# Patient Record
Sex: Female | Born: 1985 | Race: White | Hispanic: No | Marital: Married | Smoking: Former smoker
Health system: Southern US, Community
[De-identification: ages and names within clinical notes are randomized; demographics above are authoritative.]

## PROBLEM LIST (undated history)

## (undated) HISTORY — PX: WISDOM TOOTH EXTRACTION: SHX21

## (undated) HISTORY — PX: TUBAL LIGATION: SHX77

## (undated) HISTORY — PX: OTHER SURGICAL HISTORY: SHX169

---

## 2015-04-30 ENCOUNTER — Ambulatory Visit
Admission: EM | Admit: 2015-04-30 | Discharge: 2015-04-30 | Disposition: A | Discharge status: Home / Self Care | Payer: BLUE CROSS/BLUE SHIELD | Attending: Family Medicine | Admitting: Family Medicine

## 2015-04-30 ENCOUNTER — Encounter: Payer: Self-pay | Admitting: Emergency Medicine

## 2015-04-30 DIAGNOSIS — T192XXA Foreign body in vulva and vagina, initial encounter: Secondary | ICD-10-CM | POA: Diagnosis not present

## 2015-04-30 LAB — URINALYSIS COMPLETE WITH MICROSCOPIC (ARMC ONLY)
BACTERIA UA: NONE SEEN
BILIRUBIN URINE: NEGATIVE
GLUCOSE, UA: NEGATIVE mg/dL
Hgb urine dipstick: NEGATIVE
KETONES UR: NEGATIVE mg/dL
LEUKOCYTES UA: NEGATIVE
Nitrite: NEGATIVE
PH: 7 (ref 5.0–8.0)
Protein, ur: NEGATIVE mg/dL
Specific Gravity, Urine: 1.02 (ref 1.005–1.030)

## 2015-04-30 NOTE — ED Provider Notes (Signed)
CSN: [id number]     Arrival date & time 04/30/15  1634 History   First MD Initiated Contact with Patient 04/30/15 1735     Chief Complaint  Patient presents with  . Vaginal Discharge  . Abdominal Pain   (Consider location/radiation/quality/duration/timing/severity/associated sxs/prior Treatment) HPI  29 yo F with one day hx of malodorous vaginal discharge; dark in color. Had menses 04/28/15  Attempt at intercourse clast night interrupted by malodor and  Dark drainage.   No dysuria , frequency or other bladder symptoms  History reviewed. No pertinent past medical history. Past Surgical History  Procedure Laterality Date  . Tubal ligation     History reviewed. No pertinent family history. Social History  Substance Use Topics  . Smoking status: Former Games developermoker  . Smokeless tobacco: None  . Alcohol Use: Yes   OB History    No data available     Review of Systems 10 Systems reviewed and are negative for acute change except as noted in the HPI.  Allergies  Bactrim  Home Medications   Prior to Admission medications   Medication Sig Start Date End Date Taking? Authorizing Provider  spironolactone (ALDACTONE) 50 MG tablet Take 50 mg by mouth daily.   Yes Historical Provider, MD   Meds Ordered and Administered this Visit  Medications - No data to display  BP 107/63 mmHg  Pulse 67  Temp(Src) 97.5 F (36.4 C) (Tympanic)  Resp 16  Ht 5\' 3"  (1.6 m)  Wt 140 lb (63.504 kg)  BMI 24.81 kg/m2  SpO2 100%  LMP 04/28/2015 (Exact Date) No data found.   Physical Exam   VS noted, WNL  GENERAL : NAD HEENT: atraumatic , normocephalic RESP: CTA  B , no wheezing, no accessory muscle use CARD: RRR ABD: Not distended, soft , + BS, no masses GU- EG BUS wnl, fully shaved, marital introitus,mucosa healthy,CX large clean parous, retained tampon present in upper vault- removed without difficulty, No irritation or erosion of vagina. Uterus ML NSS , ad neg , RV deferred. NEURO: Good  attention, good recall, no gross neuro defecit PSYCH: speech and behavior appropriate  ED Course  Procedures (including critical care time)  Labs Review Labs Reviewed  URINALYSIS COMPLETEWITH MICROSCOPIC ([facility] ONLY) - Abnormal; Notable for the following:    Squamous Epithelial / LPF 0-5 (*)    All other components within normal limits  URINE CULTURE    Imaging Review No results found.     MDM   1. Retained tampon, initial encounter    Diagnosis and treatment discussed.Vinegar and water pre-mix douche  X one. Pelvic rest tonight.   Questions fielded, expectations and recommendations reviewed. Reasurred very common occurrence. .Discussed follow up and return parameters including no resolution or any worsening condition..  Patient expresses understanding  and agrees to plan. [facility] questions, concerns or exacerbation.     Rae HalstedLaurie W Lee, PA-C 04/30/15 1819

## 2015-04-30 NOTE — Discharge Instructions (Signed)
discussed

## 2015-04-30 NOTE — ED Notes (Signed)
Patient c/o vaginal discharge and abdominal pain on her left side since yesterday.  Patient denies N/V.

## 2015-05-02 LAB — URINE CULTURE: SPECIAL REQUESTS: NORMAL

## 2015-05-02 NOTE — ED Notes (Signed)
Final report of urine C&S shows insignificant growth 

## 2015-07-06 ENCOUNTER — Ambulatory Visit
Admission: EM | Admit: 2015-07-06 | Discharge: 2015-07-06 | Disposition: A | Discharge status: Home / Self Care | Payer: BLUE CROSS/BLUE SHIELD | Attending: Family Medicine | Admitting: Family Medicine

## 2015-07-06 ENCOUNTER — Ambulatory Visit (INDEPENDENT_AMBULATORY_CARE_PROVIDER_SITE_OTHER): Payer: BLUE CROSS/BLUE SHIELD

## 2015-07-06 DIAGNOSIS — M542 Cervicalgia: Secondary | ICD-10-CM | POA: Diagnosis not present

## 2015-07-06 DIAGNOSIS — S161XXA Strain of muscle, fascia and tendon at neck level, initial encounter: Secondary | ICD-10-CM

## 2015-07-06 DIAGNOSIS — T7411XA Adult physical abuse, confirmed, initial encounter: Secondary | ICD-10-CM | POA: Diagnosis not present
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
## 2015-10-02 ENCOUNTER — Ambulatory Visit
Admission: EM | Admit: 2015-10-02 | Discharge: 2015-10-02 | Disposition: A | Discharge status: Home / Self Care | Payer: PRIVATE HEALTH INSURANCE

## 2015-10-02 NOTE — ED Notes (Signed)
Pt here for random drug screen only. Does not need to see physician.

## 2019-04-15 ENCOUNTER — Emergency Department: Payer: BC Managed Care – PPO

## 2019-04-15 ENCOUNTER — Encounter: Payer: Self-pay | Admitting: Emergency Medicine

## 2019-04-15 ENCOUNTER — Emergency Department
Admission: EM | Admit: 2019-04-15 | Discharge: 2019-04-16 | Disposition: A | Discharge status: Home / Self Care | Payer: BC Managed Care – PPO | Attending: Emergency Medicine | Admitting: Emergency Medicine

## 2019-04-15 ENCOUNTER — Other Ambulatory Visit: Payer: Self-pay

## 2019-04-15 DIAGNOSIS — O209 Hemorrhage in early pregnancy, unspecified: Secondary | ICD-10-CM

## 2019-04-15 DIAGNOSIS — N939 Abnormal uterine and vaginal bleeding, unspecified: Secondary | ICD-10-CM

## 2019-04-15 DIAGNOSIS — Z3A00 Weeks of gestation of pregnancy not specified: Secondary | ICD-10-CM | POA: Diagnosis not present

## 2019-04-15 DIAGNOSIS — Z87891 Personal history of nicotine dependence: Secondary | ICD-10-CM | POA: Diagnosis not present

## 2019-04-15 LAB — URINALYSIS, COMPLETE (UACMP) WITH MICROSCOPIC
Bilirubin Urine: NEGATIVE
Glucose, UA: NEGATIVE mg/dL
Ketones, ur: NEGATIVE mg/dL
Leukocytes,Ua: NEGATIVE
Nitrite: NEGATIVE
Protein, ur: NEGATIVE mg/dL
RBC / HPF: >50 RBC/hpf — ABNORMAL HIGH (ref 0–5)
Specific Gravity, Urine: 1.01 (ref 1.005–1.030)
pH: 7 (ref 5.0–8.0)

## 2019-04-15 LAB — COMPREHENSIVE METABOLIC PANEL
ALT: 36 U/L (ref 0–44)
AST: 26 U/L (ref 15–41)
Albumin: 4.3 g/dL (ref 3.5–5.0)
Alkaline Phosphatase: 44 U/L (ref 38–126)
Anion gap: 10 (ref 5–15)
BUN: 17 mg/dL (ref 6–20)
CO2: 25 mmol/L (ref 22–32)
Calcium: 9.5 mg/dL (ref 8.9–10.3)
Chloride: 104 mmol/L (ref 98–111)
Creatinine, Ser: 0.88 mg/dL (ref 0.44–1.00)
GFR calc Af Amer: >60 mL/min (ref >60)
GFR calc non Af Amer: >60 mL/min (ref >60)
Glucose, Bld: 89 mg/dL (ref 70–99)
Potassium: 3.3 mmol/L — ABNORMAL LOW (ref 3.5–5.1)
Sodium: 139 mmol/L (ref 135–145)
Total Bilirubin: 0.5 mg/dL (ref 0.3–1.2)
Total Protein: 7.4 g/dL (ref 6.5–8.1)

## 2019-04-15 LAB — POCT PREGNANCY, URINE: Preg Test, Ur: POSITIVE — AB

## 2019-04-15 LAB — CBC
HCT: 36.7 % (ref 36.0–46.0)
Hemoglobin: 12.1 g/dL (ref 12.0–15.0)
MCH: 31.3 pg (ref 26.0–34.0)
MCHC: 33 g/dL (ref 30.0–36.0)
MCV: 95.1 fL (ref 80.0–100.0)
Platelets: 268 10*3/uL (ref 150–400)
RBC: 3.86 MIL/uL — ABNORMAL LOW (ref 3.87–5.11)
RDW: 11.9 % (ref 11.5–15.5)
WBC: 11.5 10*3/uL — ABNORMAL HIGH (ref 4.0–10.5)
nRBC: 0 % (ref 0.0–0.2)

## 2019-04-15 LAB — ABO/RH: ABO/RH(D): A POS

## 2019-04-15 LAB — LIPASE, BLOOD: Lipase: 36 U/L (ref 11–51)

## 2019-04-15 LAB — HCG, QUANTITATIVE, PREGNANCY: hCG, Beta Chain, Quant, S: 1045 m[IU]/mL — ABNORMAL HIGH

## 2019-04-15 MED ORDER — METHOTREXATE FOR ECTOPIC PREGNANCY
50.0000 mg/m2 | Freq: Once | INTRAMUSCULAR | Status: AC
  Administered 2019-04-16: 90 mg via INTRAMUSCULAR
  Filled 2019-04-15: qty 1

## 2019-04-15 NOTE — Discharge Instructions (Signed)
Please follow the instructions Dr. Ouida Sills gave you.  He wants you to get a beta-hCG echo noted clinic on the 17th and again 28th in the morning.  He will see you in [place] clinic on the 20th in the afternoon.  He is return for lightheadedness severe pain or very heavy bleeding.  Saturating a pad an hour is usually what they want you to return for.  To you follow all of his instructions carefully.

## 2019-04-15 NOTE — ED Notes (Addendum)
Pt reports intermittent cramping for the past few days; LMP 9/24 pt is known to be pregnant but has not been given a due date; has seen her OB/GYN but they did not see anything on [place] when she went for her first appointment; pt reports bleeding started around 3pm today; bright red; no clots; pt awake and alert, talking in complete coherent sentences; G3P2A0

## 2019-04-15 NOTE — ED Notes (Signed)
Pelvic exam performed by Dr Cinda Quest with ED tech Melody at bedside

## 2019-04-15 NOTE — ED Provider Notes (Signed)
[facility] Emergency Department Provider Note   ____________________________________________   First MD Initiated Contact with Patient 04/15/19 2156     (approximate)  I have reviewed the triage vital signs and the nursing notes.   HISTORY  Chief Complaint Vaginal Bleeding    HPI Kelsey Campbell is a 33 y.o. female positive pregnancy test 3 weeks ago.  Patient followed at [facility] clinic.  hCGs were not doubling.  Some cramping bleeding starting today.         History reviewed. No pertinent past medical history.  There are no active problems to display for this patient.   Past Surgical History:  Procedure Laterality Date  . TUBAL LIGATION    . tubal ligation reversal      Prior to Admission medications   Medication Sig Start Date End Date Taking? Authorizing Provider  spironolactone (ALDACTONE) 50 MG tablet Take 50 mg by mouth daily.    [provider]    Allergies Bactrim [sulfamethoxazole-trimethoprim]  No family history on file.  Social History Social History   Tobacco Use  . Smoking status: Former Research scientist (life sciences)  . Smokeless tobacco: Never Used  Substance Use Topics  . Alcohol use: Not Currently    Comment: occasionally  . Drug use: Not on file    Review of Systems  Constitutional: No fever/chills Eyes: No visual changes. ENT: No sore throat. Cardiovascular: Denies chest pain. Respiratory: Denies shortness of breath. Gastrointestinal: No abdominal pain.  No nausea, no vomiting.  No diarrhea.  No constipation. Genitourinary: Negative for dysuria. Musculoskeletal: Negative for back pain. Skin: Negative for rash. Neurological: Negative for headaches, focal weakness or  ____________________________________________   PHYSICAL EXAM:  VITAL SIGNS: ED Triage Vitals  Enc Vitals Group     BP 04/15/19 1854 115/72     Pulse Rate 04/15/19 1854 84     Resp 04/15/19 1854 16     Temp 04/15/19 1854 98.3 F (36.8 C)   Temp Source 04/15/19 1854 Oral     SpO2 04/15/19 1854 100 %     Weight 04/15/19 1855 165 lb (74.8 kg)     Height 04/15/19 1855 5\' 3"  (1.6 m)     Head Circumference --      Peak Flow --      Pain Score 04/15/19 1855 4     Pain Loc --      Pain Edu? --      Excl. in [place]? --     Constitutional: Alert and oriented. Well appearing and in no acute distress. Eyes: Conjunctivae are normal.  Head: Atraumatic. Nose: No congestion/rhinnorhea. Mouth/Throat: Mucous membranes are moist.  Oropharynx non-erythematous. Neck: No stridor.   Cardiovascular: Normal rate, regular rhythm. Grossly normal heart sounds.  Good peripheral circulation. Respiratory: Normal respiratory effort.  No retractions. Lungs CTAB. Gastrointestinal: Soft mild lower abdominal tenderness no distention. No abdominal bruits. GU: Some blood on perineum.  Some dark blood in vagina.  Otherwise vagina looks normal.'s difficult to reach but feels closed.  No active bleeding currently.  Minimal cervical motion tenderness no palpable adnexal tenderness or masses. Musculoskeletal: No lower extremity tenderness nor edema.   Neurologic:  Normal speech and language. No gross focal neurologic deficits are appreciated.  Skin:  Skin is warm, dry and intact. No rash noted.  ____________________________________________   LABS (all labs ordered are listed, but only abnormal results are displayed)  Labs Reviewed  COMPREHENSIVE METABOLIC PANEL - Abnormal; Notable for the following components:      Result  Value   Potassium 3.3 (*)    All other components within normal limits  CBC - Abnormal; Notable for the following components:   WBC 11.5 (*)    RBC 3.86 (*)    All other components within normal limits  URINALYSIS, COMPLETE (UACMP) WITH MICROSCOPIC - Abnormal; Notable for the following components:   Color, Urine STRAW (*)    APPearance CLEAR (*)    Hgb urine dipstick LARGE (*)    RBC / HPF >50 (*)    Bacteria, UA RARE (*)    All other  components within normal limits  HCG, QUANTITATIVE, PREGNANCY - Abnormal; Notable for the following components:   hCG, Beta Chain, Quant, S 1,045 (*)    All other components within normal limits  POCT PREGNANCY, URINE - Abnormal; Notable for the following components:   Preg Test, Ur POSITIVE (*)    All other components within normal limits  LIPASE, BLOOD  POC URINE PREG, ED  ABO/RH   ____________________________________________  EKG   ____________________________________________  RADIOLOGY  ED MD interpretation: OB reports a cyst.  Official radiology report(s): US Ob Less Than 14 Weeks With Ob Transvaginal  Result Date: 04/15/2019 CLINICAL DATA:  Vaginal bleeding EXAM: OBSTETRIC <14 WK [place] AND TRANSVAGINAL OB US TECHNIQUE: Both transabdominal and transvaginal ultrasound examinations were performed for complete evaluation of the gestation as well as the maternal uterus, adnexal regions, and pelvic cul-de-sac. Transvaginal technique was performed to assess early pregnancy. COMPARISON:  None. FINDINGS: Intrauterine gestational sac: None Yolk sac:  Not visualized Embryo:  Not visualized Cardiac Activity: Heart Rate:   bpm MSD:   mm    w     d CRL:    mm    w    d                  [place] EDC: Subchorionic hemorrhage:  NA Maternal uterus/adnexae: In the right adnexal region, there is a small cystic structure with thick wall measuring 1.9 x 1.7 cm. It is difficult to determine if this is within the right ovary or immediately adjacent. Cannot exclude ectopic pregnancy. IMPRESSION: No intrauterine pregnancy. 1.9 x 1.7 cm thick walled cystic area within the right adnexa. It is difficult to determine if this is within or immediately adjacent to the right ovary. It is difficult to exclude ectopic pregnancy. Electronically Signed   By: Charlett Nose M.D.   On: 04/15/2019 21:41    ____________________________________________   PROCEDURES  Procedure(s) performed (including Critical Care):  Procedures    ____________________________________________   INITIAL IMPRESSION / ASSESSMENT AND PLAN / ED COURSE  I discussed the patient with Dr. Feliberto Gottron.  He is going to come in and talk to her.  Consider methotrexate.              ____________________________________________   FINAL CLINICAL IMPRESSION(S) / ED DIAGNOSES  Final diagnoses:  Vaginal bleeding affecting early pregnancy     ED Discharge Orders    None       Note:  This document was prepared using Dragon voice recognition software and may include unintentional dictation errors.    Arnaldo Natal, MD 04/15/19 2223

## 2019-04-15 NOTE — ED Notes (Signed)
Pt resting in bed; husband at bedside; OB/GYN has already been in to consult with pt; pt given water as requested;

## 2019-04-15 NOTE — ED Triage Notes (Signed)
Patient presents to the ED with lower abdominal cramping and vaginal bleeding.  Patient states, the bleeding seems like a menstrual period.  Patient had a positive pregnancy test 3 weeks ago.  LMP:  02/23/19.  Patient states she is followed by an OB and they were concerned because her HCG levels weren't doubling.  Patient also reports a recent US where they did not see an embryo. Patient states she had a tubal ligation reversal on August 3rd.

## 2019-04-15 NOTE — ED Notes (Signed)
Awaiting certified RN for methotrexate injection

## 2019-04-16 NOTE — ED Notes (Signed)
Secretary paging Dr Ouida Sills for Dr Aldona Lento chemo certified RN working or reachable at home to come give injection;

## 2019-04-16 NOTE — Consult Note (Signed)
NAME: Kelsey Campbell, Kelsey Campbell MEDICAL RECORD [id number] ACCOUNT [id number] DATE OF BIRTH:September 21, 1985 FACILITY: [facility] LOCATION: [facility] PHYSICIAN:THOMAS Josefine Class, MD  CONSULTATION  DATE OF ADMISSION:  04/15/2019  REFERRING PHYSICIAN:  Conni Slipper, MD  CONSULTANT:  Laverta Baltimore, MD  HISTORY OF PRESENT ILLNESS:  A 33 year old gravida 3, para 2 patient being followed at [facility] for early gestation.  LMP 02/23/2019, consistent with 7 weeks 2 days by dating.  The patient presented to the Emergency Department for generalized  lower pelvic pain and uterine bleeding.  The patient is known to have a tubal reanastomosis in 12/2018 and has been followed at [facility] with the following quantitative beta hCG:  04/06/2019 equals 753; 04/11/2019 equals 938; 04/13/2019 equals  981.  Blood type A positive.  The patient underwent an ultrasound at [facility] Emergency Department that showed an empty uterine sac and a right adnexal mass measuring 1.9 x 1.7 cm.  PAST MEDICAL HISTORY:  Unremarkable.  PAST SURGICAL HISTORY: 1.  Status post bilateral tubal ligation. 2.  Bilateral tubal reanastomosis on 12/2018. 3.  Ovarian cystectomy.  FAMILY HISTORY:   1.  Alzheimer disease in maternal grandfather. 2.  Type 2 diabetes in maternal aunt and maternal grandfather. 3.  Prostate cancer in maternal grandfather. 4.  Throat cancer in maternal grandfather.  REVIEW OF SYSTEMS:   EYES, EARS, NOSE AND THROAT:  No vision change.  No dysphagia.  No hearing difficulties. CARDIAC:  No chest pain, no irregular heartbeat. PULMONARY:  No shortness of breath. GASTROINTESTINAL:  No diarrhea, no constipation. GENITOURINARY:  Positive vaginal bleeding.  No chronic urinary infections, no urolithiasis. MUSCULOSKELETAL:  No muscle weakness. NEUROLOGIC:  No headache, no seizure activity. PSYCHIATRIC:  No depression.  SOCIAL HISTORY:  Has quit smoking; has used alcohol in the past,  none currently.  No drug use.  ALLERGIES:  ALLERGIC TO BACTRIM.  MEDICATIONS:  Prenatal vitamins, Phenergan.  PHYSICAL EXAMINATION: GENERAL:  Well-developed, well-nourished white female in no acute distress. VITAL SIGNS:  Blood pressure 120/72, pulse 101. LUNGS:  Clear to auscultation.   CARDIOVASCULAR:  Regular rate and rhythm. ABDOMEN:  Nondistended, soft, mild tenderness to palpation in bilateral lower quadrants. PELVIC:  Exam is deferred.  LABORATORY DATA:  CBC:  White blood count 11,000; hemoglobin 12.1; hematocrit 36; platelets 268,000.  Metabolic panel:  Sodium 562, potassium 3.3, chloride 104, bicarbonate 25, BUN and creatinine of 17 and 0.8 respectively.  Beta hCG 1045.  ASSESSMENT:  Abnormal rising beta hCG levels with an empty uterus on pelvic ultrasound.  Right adnexal mass consistent with a right ectopic pregnancy.  The patient is at increased risk for an ectopic pregnancy given her recent tubal anastomosis.  The  patient is clinically stable and labs are normal other than mild hypokalemia.    PLANS:   1.  Explained the pathophysiology of an ectopic pregnancy to the patient.  The patient meets criteria for methotrexate treatment for an ectopic pregnancy.  I gave her the option for surgical intervention with right salpingectomy.  After consideration,  the patient elects to undergo methotrexate treatment.  The patient will be injected with 92 mg of methotrexate based on her body surface area and 50 mg/sq m calculation.  The patient will return to [facility] on 04/18/2019, which is post-injection  day 4, and 04/21/2019, which is post-injection day 7. 2.  The patient was instructed to stop prenatal vitamins and refrain from vegetables and green leafy vegetables.  She is given precautions if pain increases or bleeding becomes  extremely heavy, that she will call me at [facility] or return to the  Emergency Department.  All questions have been answered.  JN/NUANCE  D:04/15/2019 T:04/16/2019 JOB:[id number]

## 2019-04-21 ENCOUNTER — Other Ambulatory Visit: Payer: Self-pay

## 2019-04-21 ENCOUNTER — Other Ambulatory Visit: Payer: Self-pay | Admitting: Obstetrics and Gynecology

## 2019-04-21 ENCOUNTER — Ambulatory Visit
Admission: RE | Admit: 2019-04-21 | Discharge: 2019-04-21 | Disposition: A | Discharge status: Home / Self Care | Payer: BC Managed Care – PPO | Source: Ambulatory Visit | Attending: Obstetrics and Gynecology | Admitting: Obstetrics and Gynecology

## 2019-04-21 DIAGNOSIS — O09521 Supervision of elderly multigravida, first trimester: Secondary | ICD-10-CM | POA: Insufficient documentation

## 2019-04-21 DIAGNOSIS — O009 Unspecified ectopic pregnancy without intrauterine pregnancy: Secondary | ICD-10-CM | POA: Insufficient documentation

## 2019-04-21 DIAGNOSIS — Z3A01 Less than 8 weeks gestation of pregnancy: Secondary | ICD-10-CM | POA: Insufficient documentation

## 2019-04-21 MED ORDER — METHOTREXATE FOR ECTOPIC PREGNANCY
50.0000 mg/m2 | Freq: Once | INTRAMUSCULAR | Status: AC
  Administered 2019-04-21: 90 mg via INTRAMUSCULAR
  Filled 2019-04-21: qty 1

## 2019-05-03 ENCOUNTER — Ambulatory Visit: Payer: BC Managed Care – PPO | Admitting: Certified Registered"

## 2019-05-03 ENCOUNTER — Other Ambulatory Visit: Payer: Self-pay

## 2019-05-03 ENCOUNTER — Other Ambulatory Visit: Payer: Self-pay | Admitting: Obstetrics and Gynecology

## 2019-05-03 ENCOUNTER — Ambulatory Visit
Admission: AD | Admit: 2019-05-03 | Discharge: 2019-05-03 | Disposition: A | Discharge status: Home / Self Care | Payer: BC Managed Care – PPO | Source: Ambulatory Visit | Attending: Obstetrics and Gynecology | Admitting: Obstetrics and Gynecology

## 2019-05-03 ENCOUNTER — Encounter
Admission: AD | Disposition: A | Discharge status: Home / Self Care | Payer: Self-pay | Source: Ambulatory Visit | Attending: Obstetrics and Gynecology

## 2019-05-03 ENCOUNTER — Encounter: Payer: Self-pay | Admitting: Anesthesiology

## 2019-05-03 DIAGNOSIS — Z20828 Contact with and (suspected) exposure to other viral communicable diseases: Secondary | ICD-10-CM | POA: Insufficient documentation

## 2019-05-03 DIAGNOSIS — K660 Peritoneal adhesions (postprocedural) (postinfection): Secondary | ICD-10-CM | POA: Insufficient documentation

## 2019-05-03 DIAGNOSIS — Z881 Allergy status to other antibiotic agents status: Secondary | ICD-10-CM | POA: Diagnosis not present

## 2019-05-03 DIAGNOSIS — O00101 Right tubal pregnancy without intrauterine pregnancy: Secondary | ICD-10-CM | POA: Diagnosis present

## 2019-05-03 DIAGNOSIS — K661 Hemoperitoneum: Secondary | ICD-10-CM | POA: Diagnosis not present

## 2019-05-03 DIAGNOSIS — Z3A00 Weeks of gestation of pregnancy not specified: Secondary | ICD-10-CM | POA: Diagnosis not present

## 2019-05-03 DIAGNOSIS — Z87891 Personal history of nicotine dependence: Secondary | ICD-10-CM | POA: Diagnosis not present

## 2019-05-03 HISTORY — PX: LAPAROSCOPIC OVARIAN CYSTECTOMY: SHX6248

## 2019-05-03 LAB — SARS CORONAVIRUS 2 BY RT PCR (HOSPITAL ORDER, PERFORMED IN ~~LOC~~ HOSPITAL LAB): SARS Coronavirus 2: NEGATIVE

## 2019-05-03 LAB — CBC
HCT: 33.4 % — ABNORMAL LOW (ref 36.0–46.0)
Hemoglobin: 11.4 g/dL — ABNORMAL LOW (ref 12.0–15.0)
MCH: 31.8 pg (ref 26.0–34.0)
MCHC: 34.1 g/dL (ref 30.0–36.0)
MCV: 93 fL (ref 80.0–100.0)
Platelets: 290 10*3/uL (ref 150–400)
RBC: 3.59 MIL/uL — ABNORMAL LOW (ref 3.87–5.11)
RDW: 12.8 % (ref 11.5–15.5)
WBC: 8.8 10*3/uL (ref 4.0–10.5)
nRBC: 0 % (ref 0.0–0.2)

## 2019-05-03 LAB — BASIC METABOLIC PANEL
Anion gap: 9 (ref 5–15)
BUN: 16 mg/dL (ref 6–20)
CO2: 25 mmol/L (ref 22–32)
Calcium: 9.4 mg/dL (ref 8.9–10.3)
Chloride: 104 mmol/L (ref 98–111)
Creatinine, Ser: 0.78 mg/dL (ref 0.44–1.00)
GFR calc Af Amer: >60 mL/min (ref >60)
GFR calc non Af Amer: >60 mL/min (ref >60)
Glucose, Bld: 102 mg/dL — ABNORMAL HIGH (ref 70–99)
Potassium: 3.9 mmol/L (ref 3.5–5.1)
Sodium: 138 mmol/L (ref 135–145)

## 2019-05-03 LAB — TYPE AND SCREEN
ABO/RH(D): A POS
Antibody Screen: NEGATIVE

## 2019-05-03 SURGERY — EXCISION, CYST, OVARY, LAPAROSCOPIC
Anesthesia: General

## 2019-05-03 MED ORDER — MIDAZOLAM HCL 2 MG/2ML IJ SOLN
INTRAMUSCULAR | Status: AC
  Filled 2019-05-03: qty 2

## 2019-05-03 MED ORDER — FENTANYL CITRATE (PF) 100 MCG/2ML IJ SOLN
INTRAMUSCULAR | Status: DC | PRN
  Administered 2019-05-03 (×2): 50 ug via INTRAVENOUS
  Administered 2019-05-03: 100 ug via INTRAVENOUS

## 2019-05-03 MED ORDER — FAMOTIDINE 20 MG PO TABS
20.0000 mg | ORAL_TABLET | Freq: Once | ORAL | Status: AC
  Administered 2019-05-03: 20 mg via ORAL

## 2019-05-03 MED ORDER — FENTANYL CITRATE (PF) 100 MCG/2ML IJ SOLN
INTRAMUSCULAR | Status: AC
  Filled 2019-05-03: qty 2

## 2019-05-03 MED ORDER — OXYCODONE HCL 5 MG PO TABS
ORAL_TABLET | ORAL | Status: AC
  Filled 2019-05-03: qty 1

## 2019-05-03 MED ORDER — PHENYLEPHRINE HCL (PRESSORS) 10 MG/ML IV SOLN
INTRAVENOUS | Status: DC | PRN
  Administered 2019-05-03: 100 ug via INTRAVENOUS

## 2019-05-03 MED ORDER — LACTATED RINGERS IV SOLN: INTRAVENOUS | Status: DC

## 2019-05-03 MED ORDER — SUGAMMADEX SODIUM 200 MG/2ML IV SOLN
INTRAVENOUS | Status: AC
  Filled 2019-05-03: qty 2

## 2019-05-03 MED ORDER — GABAPENTIN 300 MG PO CAPS
ORAL_CAPSULE | ORAL | Status: AC
  Filled 2019-05-03: qty 1

## 2019-05-03 MED ORDER — LIDOCAINE HCL (CARDIAC) PF 100 MG/5ML IV SOSY
PREFILLED_SYRINGE | INTRAVENOUS | Status: DC | PRN
  Administered 2019-05-03: 60 mg via INTRAVENOUS

## 2019-05-03 MED ORDER — ONDANSETRON HCL 4 MG/2ML IJ SOLN
INTRAMUSCULAR | Status: AC
  Filled 2019-05-03: qty 2

## 2019-05-03 MED ORDER — PROPOFOL 10 MG/ML IV BOLUS
INTRAVENOUS | Status: AC
  Filled 2019-05-03: qty 20

## 2019-05-03 MED ORDER — ONDANSETRON HCL 4 MG/2ML IJ SOLN
INTRAMUSCULAR | Status: DC | PRN
  Administered 2019-05-03: 4 mg via INTRAVENOUS

## 2019-05-03 MED ORDER — ROCURONIUM BROMIDE 100 MG/10ML IV SOLN
INTRAVENOUS | Status: DC | PRN
  Administered 2019-05-03: 5 mg via INTRAVENOUS
  Administered 2019-05-03: 45 mg via INTRAVENOUS

## 2019-05-03 MED ORDER — OXYCODONE HCL 5 MG/5ML PO SOLN: 5.0000 mg | Freq: Once | ORAL | Status: AC | PRN

## 2019-05-03 MED ORDER — SUCCINYLCHOLINE CHLORIDE 20 MG/ML IJ SOLN
INTRAMUSCULAR | Status: DC | PRN
  Administered 2019-05-03: 100 mg via INTRAVENOUS

## 2019-05-03 MED ORDER — MIDAZOLAM HCL 2 MG/2ML IJ SOLN
INTRAMUSCULAR | Status: DC | PRN
  Administered 2019-05-03: 2 mg via INTRAVENOUS

## 2019-05-03 MED ORDER — ROCURONIUM BROMIDE 50 MG/5ML IV SOLN
INTRAVENOUS | Status: AC
  Filled 2019-05-03: qty 1

## 2019-05-03 MED ORDER — FAMOTIDINE 20 MG PO TABS
ORAL_TABLET | ORAL | Status: AC
  Filled 2019-05-03: qty 1

## 2019-05-03 MED ORDER — DEXAMETHASONE SODIUM PHOSPHATE 10 MG/ML IJ SOLN
INTRAMUSCULAR | Status: DC | PRN
  Administered 2019-05-03: 10 mg via INTRAVENOUS

## 2019-05-03 MED ORDER — SUGAMMADEX SODIUM 200 MG/2ML IV SOLN
INTRAVENOUS | Status: DC | PRN
  Administered 2019-05-03: 150 mg via INTRAVENOUS

## 2019-05-03 MED ORDER — DEXAMETHASONE SODIUM PHOSPHATE 10 MG/ML IJ SOLN
INTRAMUSCULAR | Status: AC
  Filled 2019-05-03: qty 1

## 2019-05-03 MED ORDER — BUPIVACAINE HCL 0.5 % IJ SOLN
INTRAMUSCULAR | Status: DC | PRN
  Administered 2019-05-03: 8 mL

## 2019-05-03 MED ORDER — LACTATED RINGERS IV SOLN
INTRAVENOUS | Status: DC
  Administered 2019-05-03: 18:00:00 via INTRAVENOUS

## 2019-05-03 MED ORDER — OXYCODONE HCL 5 MG PO TABS
5.0000 mg | ORAL_TABLET | Freq: Once | ORAL | Status: AC | PRN
  Administered 2019-05-03: 5 mg via ORAL

## 2019-05-03 MED ORDER — BUPIVACAINE HCL (PF) 0.5 % IJ SOLN
INTRAMUSCULAR | Status: AC
  Filled 2019-05-03: qty 30

## 2019-05-03 MED ORDER — FENTANYL CITRATE (PF) 100 MCG/2ML IJ SOLN
25.0000 ug | INTRAMUSCULAR | Status: DC | PRN
  Administered 2019-05-03 (×2): 50 ug via INTRAVENOUS

## 2019-05-03 MED ORDER — GABAPENTIN 300 MG PO CAPS
300.0000 mg | ORAL_CAPSULE | ORAL | Status: AC
  Administered 2019-05-03: 300 mg via ORAL

## 2019-05-03 MED ORDER — ACETAMINOPHEN 500 MG PO TABS
1000.0000 mg | ORAL_TABLET | ORAL | Status: AC
  Administered 2019-05-03: 1000 mg via ORAL

## 2019-05-03 MED ORDER — FENTANYL CITRATE (PF) 100 MCG/2ML IJ SOLN
INTRAMUSCULAR | Status: AC
  Administered 2019-05-03: 50 ug via INTRAVENOUS
  Filled 2019-05-03: qty 2

## 2019-05-03 MED ORDER — ACETAMINOPHEN 500 MG PO TABS
ORAL_TABLET | ORAL | Status: AC
  Filled 2019-05-03: qty 2

## 2019-05-03 MED ORDER — PROPOFOL 10 MG/ML IV BOLUS
INTRAVENOUS | Status: DC | PRN
  Administered 2019-05-03: 170 mg via INTRAVENOUS

## 2019-05-03 SURGICAL SUPPLY — 38 items
BAG URINE DRAIN 2000ML AR STRL (UROLOGICAL SUPPLIES) ×3 IMPLANT
BLADE SURG SZ11 CARB STEEL (BLADE) ×3 IMPLANT
CANISTER SUCT 1200ML W/VALVE (MISCELLANEOUS) ×3 IMPLANT
CATH ROBINSON RED A/P 16FR (CATHETERS) ×3 IMPLANT
CHLORAPREP W/TINT 26 (MISCELLANEOUS) ×3 IMPLANT
CLOSURE WOUND 1/4X4 (GAUZE/BANDAGES/DRESSINGS) ×1
COVER WAND RF STERILE (DRAPES) IMPLANT
DRSG TEGADERM 2-3/8X2-3/4 SM (GAUZE/BANDAGES/DRESSINGS) ×3 IMPLANT
GLOVE BIO SURGEON STRL SZ8 (GLOVE) ×6 IMPLANT
GOWN STRL REUS W/ TWL LRG LVL3 (GOWN DISPOSABLE) ×2 IMPLANT
GOWN STRL REUS W/ TWL XL LVL3 (GOWN DISPOSABLE) ×1 IMPLANT
GOWN STRL REUS W/TWL LRG LVL3 (GOWN DISPOSABLE) ×4
GOWN STRL REUS W/TWL XL LVL3 (GOWN DISPOSABLE) ×2
GRASPER SUT TROCAR 14GX15 (MISCELLANEOUS) IMPLANT
IRRIGATION STRYKERFLOW (MISCELLANEOUS) IMPLANT
IRRIGATOR STRYKERFLOW (MISCELLANEOUS) ×3
IV NS 1000ML (IV SOLUTION)
IV NS 1000ML BAXH (IV SOLUTION) IMPLANT
KIT TURNOVER CYSTO (KITS) ×3 IMPLANT
LABEL OR SOLS (LABEL) ×3 IMPLANT
NS IRRIG 500ML POUR BTL (IV SOLUTION) ×3 IMPLANT
PACK GYN LAPAROSCOPIC (MISCELLANEOUS) ×3 IMPLANT
PAD OB MATERNITY 4.3X12.25 (PERSONAL CARE ITEMS) ×3 IMPLANT
PAD PREP 24X41 OB/GYN DISP (PERSONAL CARE ITEMS) ×3 IMPLANT
POUCH SPECIMEN RETRIEVAL 10MM (ENDOMECHANICALS) IMPLANT
SET TUBE SMOKE EVAC HIGH FLOW (TUBING) ×3 IMPLANT
SHEARS HARMONIC ACE PLUS 36CM (ENDOMECHANICALS) ×2 IMPLANT
SLEEVE ENDOPATH XCEL 5M (ENDOMECHANICALS) ×2 IMPLANT
SPONGE GAUZE 2X2 8PLY STER LF (GAUZE/BANDAGES/DRESSINGS) ×1
SPONGE GAUZE 2X2 8PLY STRL LF (GAUZE/BANDAGES/DRESSINGS) ×2 IMPLANT
STRIP CLOSURE SKIN 1/4X4 (GAUZE/BANDAGES/DRESSINGS) ×2 IMPLANT
SUT VIC AB 0 CT1 36 (SUTURE) ×3 IMPLANT
SUT VIC AB 2-0 UR6 27 (SUTURE) ×3 IMPLANT
SUT VIC AB 4-0 SH 27 (SUTURE) ×2
SUT VIC AB 4-0 SH 27XANBCTRL (SUTURE) ×1 IMPLANT
SWABSTK COMLB BENZOIN TINCTURE (MISCELLANEOUS) ×3 IMPLANT
TROCAR ENDO BLADELESS 11MM (ENDOMECHANICALS) ×2 IMPLANT
TROCAR XCEL NON-BLD 5MMX100MML (ENDOMECHANICALS) ×3 IMPLANT

## 2019-05-03 NOTE — Anesthesia Procedure Notes (Signed)
Procedure Name: Intubation Date/Time: 05/03/2019 6:45 PM Performed by: Jonna Clark, CRNA Pre-anesthesia Checklist: Patient identified, Patient being monitored, Timeout performed, Emergency Drugs available and Suction available Patient Re-evaluated:Patient Re-evaluated prior to induction Oxygen Delivery Method: Circle system utilized Preoxygenation: Pre-oxygenation with 100% oxygen Induction Type: IV induction, Rapid sequence and Cricoid Pressure applied Ventilation: Mask ventilation without difficulty Laryngoscope Size: Mac and 3 Grade View: Grade II Tube type: Oral Tube size: 7.0 mm Number of attempts: 1 Airway Equipment and Method: Stylet Placement Confirmation: ETT inserted through vocal cords under direct vision,  positive ETCO2 and breath sounds checked- equal and bilateral Secured at: 21 cm Tube secured with: Tape Dental Injury: Teeth and Oropharynx as per pre-operative assessment

## 2019-05-03 NOTE — H&P (Signed)
Chief Complaint:   Kelsey Campbell is a 33 y.o. female here for Discuss sono she presents today with increased abd pain severe starting a few hours ago . She is being follow for right tubal ectopic pregnancy  She is s/p MTX x 2  S/p tubal reversal in Aug 2020. She has been follow with serial BHCG and last 500 done from 1000 BT: A+  Past Medical History: has no past medical history on file.  Past Surgical History: has a past surgical history that includes Cystectomy; Tubal ligation; and Tubal reversal. Family History: family history includes Alzheimer's disease in her maternal grandfather; Diabetes type II in her maternal aunt and maternal grandfather; No Known Problems in her brother, mother, and sister; Prostate cancer in her maternal grandfather; Throat cancer in her maternal grandfather. Social History: reports that she has quit smoking. She has never used smokeless tobacco. She reports current alcohol use. She reports that she does not use drugs. OB/GYN History:  OB History  Gravida  3  Para  2  Term  2  Preterm   AB   Living  2   SAB   TAB   Ectopic   Molar   Multiple   Live Births  2      Allergies: is allergic to bactrim [sulfamethoxazole-trimethoprim]. Medications:  Current Outpatient Medications:  . prenatal vit-iron fum-folic ac (PRENAVITE) tablet, Take 1 tablet by mouth once daily, Disp: , Rfl:  . promethazine (PHENERGAN) 25 MG tablet, TAKE 1 TABLET BY MOUTH EVERY 4 HOURS AS NEEDED FOR NAUSEA, Disp: , Rfl:   Review of Systems: General: No fatigue or weight loss Eyes: No vision changes Ears: No hearing difficulty Respiratory: No cough or shortness of breath Pulmonary: No asthma or shortness of breath Cardiovascular: No chest pain, palpitations, dyspnea on exertion Gastrointestinal: No abdominal bloating, chronic diarrhea, constipations, masses, pain or hematochezia Genitourinary: No hematuria, dysuria, abnormal vaginal discharge, pelvic pain,  Menometrorrhagia Lymphatic: No swollen lymph nodes Musculoskeletal: No muscle weakness Neurologic: No extremity weakness, syncope, seizure disorder Psychiatric: No history of depression, delusions or suicidal/homicidal ideation  Exam:   Vitals:  05/03/19 1603  BP: 115/69  Pulse: 69   Body mass index is 29.94 kg/m.  WDWN white/ female in NAD  Lungs: CTA  CV : RRR without murmur  Neck: no thyromegaly Abdomen: soft , + TTP diffuse . No rebound tenderness U/s:LMP=02/23/2019  Uterus anteverted  No IUP seen  Endometrium=5.70mm  Free fluid seen in ACDS & PCDS  Lt ovary appears wnl  Rt ovary not definitely seen  Rt adnexal mass=6.08 x 6.34 x 4.10cm    Impression:   The encounter diagnosis was Ectopic pregnancy, unspecified location, unspecified whether intrauterine pregnancy present. Possible rupture Dramatic enlargement of the right ectopic from last Failed MTX treatment   Plan:   Explained the findings on the u/s and the need for surgical intervention . given the size of the mass and her prior Tubal reanastomosis the patient will be best served right salpingectomy , not salpingostomy  60 minutes counseling pt coordinating her admission and scheduling surgery emergently   Return in about 2 weeks (around 05/17/2019) for postop.  Caroline Sauger, MD

## 2019-05-03 NOTE — Anesthesia Postprocedure Evaluation (Signed)
Anesthesia Post Note  Patient: Kelsey Campbell  Procedure(s) Performed: LAPAROSCOPIC SALPINGECTOMY (N/A )  Patient location during evaluation: PACU Anesthesia Type: General Level of consciousness: awake and alert Pain management: pain level controlled Vital Signs Assessment: post-procedure vital signs reviewed and stable Respiratory status: spontaneous breathing, nonlabored ventilation, respiratory function stable and patient connected to nasal cannula oxygen Cardiovascular status: blood pressure returned to baseline and stable Postop Assessment: no apparent nausea or vomiting Anesthetic complications: no     Last Vitals:  Vitals:   05/03/19 2058 05/03/19 2113  BP: (!) 93/55 (!) 90/52  Pulse: 69 62  Resp: 15 17  Temp:  36.6 C  SpO2: 100% 100%    Last Pain:  Vitals:   05/03/19 2113  TempSrc:   PainSc: 3                  [name]

## 2019-05-03 NOTE — Anesthesia Post-op Follow-up Note (Signed)
Anesthesia QCDR form completed.        

## 2019-05-03 NOTE — Transfer of Care (Signed)
Immediate Anesthesia Transfer of Care Note  Patient: Kelsey Campbell  Procedure(s) Performed: LAPAROSCOPIC SALPINGECTOMY (N/A )  Patient Location: PACU  Anesthesia Type:General  Level of Consciousness: drowsy and patient cooperative  Airway & Oxygen Therapy: Patient Spontanous Breathing and Patient connected to nasal cannula oxygen  Post-op Assessment: Report given to RN and Post -op Vital signs reviewed and stable  Post vital signs: Reviewed and stable  Last Vitals:  Vitals Value Taken Time  BP 113/71 05/03/19 2028  Temp 36.4 C 05/03/19 2028  Pulse 63 05/03/19 2029  Resp 16 05/03/19 2029  SpO2 100 % 05/03/19 2029  Vitals shown include unvalidated device data.  Last Pain:  Vitals:   05/03/19 1741  TempSrc: Temporal  PainSc: 2       Patients Stated Pain Goal: 0 (26/94/85 [id number])  Complications: No apparent anesthesia complications

## 2019-05-03 NOTE — Anesthesia Preprocedure Evaluation (Signed)
Anesthesia Evaluation  Patient identified by MRN, date of birth, ID band Patient awake    Reviewed: Allergy & Precautions, H&P , NPO status , Patient's Chart, lab work & pertinent test results  History of Anesthesia Complications Negative for: history of anesthetic complications  Airway Mallampati: III  TM Distance: >3 FB Neck ROM: full    Dental  (+) Chipped   Pulmonary neg shortness of breath, former smoker,           Cardiovascular Exercise Tolerance: Good (-) angina(-) Past MI and (-) DOE negative cardio ROS       Neuro/Psych negative neurological ROS  negative psych ROS   GI/Hepatic negative GI ROS, Neg liver ROS, neg GERD  ,  Endo/Other  negative endocrine ROS  Renal/GU      Musculoskeletal   Abdominal   Peds  Hematology negative hematology ROS (+)   Anesthesia Other Findings ruptured ectopic   Past Surgical History: No date: TUBAL LIGATION No date: tubal ligation reversal     Reproductive/Obstetrics negative OB ROS                             Anesthesia Physical Anesthesia Plan  ASA: II and emergent  Anesthesia Plan: General ETT, Rapid Sequence and Cricoid Pressure   Post-op Pain Management:    Induction: Intravenous  PONV Risk Score and Plan: Ondansetron, Dexamethasone, Midazolam and Treatment may vary due to age or medical condition  Airway Management Planned: Oral ETT  Additional Equipment:   Intra-op Plan:   Post-operative Plan: Extubation in OR  Informed Consent: I have reviewed the patients History and Physical, chart, labs and discussed the procedure including the risks, benefits and alternatives for the proposed anesthesia with the patient or authorized representative who has indicated his/her understanding and acceptance.     Dental Advisory Given  Plan Discussed with: Anesthesiologist, CRNA and Surgeon  Anesthesia Plan Comments: (Patient  consented for risks of anesthesia including but not limited to:  - adverse reactions to medications - damage to teeth, lips or other oral mucosa - sore throat or hoarseness - Damage to heart, brain, lungs or loss of life  Patient voiced understanding.)        Anesthesia Quick Evaluation

## 2019-05-03 NOTE — Discharge Instructions (Signed)

## 2019-05-03 NOTE — Brief Op Note (Signed)
05/03/2019  8:24 PM  PATIENT:  Kelsey Campbell  33 y.o. female  PRE-OPERATIVE DIAGNOSIS:  ruptured ectopic right tube   POST-OPERATIVE DIAGNOSIS:  Same as above . Abdominal adhesion, hemoperitoneum  PROCEDURE:  Procedure(s): LAPAROSCOPIC SALPINGECTOMY (N/A) Laparoscopic right salpingectomy , LOA  SURGEON:  Surgeon(s) and Role:    * Schermerhorn, Gwen Her, MD - Primary  PHYSICIAN ASSISTANT: CST  ASSISTANTS: none   ANESTHESIA:   general  EBL:  5 mL   BLOOD ADMINISTERED:none  DRAINS: none   LOCAL MEDICATIONS USED:  MARCAINE     SPECIMEN:  Source of Specimen:  right tube and ectopic   DISPOSITION OF SPECIMEN:  PATHOLOGY  COUNTS:  YES  TOURNIQUET:  * No tourniquets in log *  DICTATION: .Other Dictation: Dictation Number verbal  PLAN OF CARE: Discharge to home after PACU  PATIENT DISPOSITION:  PACU - hemodynamically stable.   Delay start of Pharmacological VTE agent (>24hrs) due to surgical blood loss or risk of bleeding: not applicable

## 2019-05-04 ENCOUNTER — Encounter: Payer: Self-pay | Admitting: Obstetrics and Gynecology

## 2019-05-04 NOTE — Op Note (Signed)
NAME: Kelsey Campbell, [place] MEDICAL RECORD [id number] ACCOUNT [id number] DATE OF BIRTH:1986-01-28 FACILITY: [facility] LOCATION: [facility] PHYSICIAN:THOMAS Josefine Class, MD  OPERATIVE REPORT  DATE OF PROCEDURE:  05/03/2019  PREOPERATIVE DIAGNOSES:   1.  Ruptured ectopic, right fallopian tube. 2.  Failed methotrexate treatment.  POSTOPERATIVE DIAGNOSES: 1.  Ruptured right fallopian tube ectopic pregnancy. 2.  Hemoperitoneum. 3.  Abdominal adhesions.  PROCEDURES: 1.  Laparoscopic right salpingectomy. 2.  Lysis of adhesions.  ANESTHESIA:  General endotracheal anesthesia.  SURGEON:  Laverta Baltimore, MD  INDICATIONS:  A 33 year old female being followed for a right tubal ectopic pregnancy.  The patient is status post 2 separate methotrexate dosing for medical management of ectopic pregnancies.  Quantitative hCG levels were falling until the day of the  procedure when the patient developed acute onset of right-sided abdominal pain.  Ultrasound that day revealed the adnexal mass had grown from 2 cm to 6 cm.  There was also free fluid noted in the pelvis.  DESCRIPTION OF PROCEDURE:  After adequate general endotracheal anesthesia, the patient was placed in dorsal supine position with legs in the Wainscott stirrups.  The patient's abdomen, perineum and vagina were prepped and draped in normal sterile fashion.   Timeout was performed.  Straight catheterization of the bladder yielded 150 mL clear urine.  Gloves were changed.  Attention was directed to the patient's abdomen where a 5 mm infraumbilical incision was made after injecting with 0.5% Marcaine.  A 5 mm  trocar with the laparoscope was advanced into the abdominal cavity under direct visualization.  Initial impression revealed free flowing blood in the lower abdomen and the pelvis.  A second site was placed in the left lower quadrant 3 cm medial to the  left anterior iliac spine.  An 11 mm trocar was advanced under direct visualization.   Likewise on the right lower quadrant, a 5 mm trocar was advanced 3 cm medial to the right anterior iliac spine.  Initial impression revealed approximately 200 mL  hemoperitoneum.  The right fallopian tube was dilated two-thirds of its distance and was adherent to the right ovary and the omentum.  Dissection occurred to remove the omentum and the Harmonic scalpel was brought up and the right fallopian tube was  dissected free from the ovary.  The ovary demonstrated some bleeding, which was handled with the Kleppinger cautery.  The fallopian tube was dissected all the way to the right cornua and then transected with the Harmonic scalpel.  Left fallopian tube  with the anastomosis site appeared normal, as well as the fimbriated end of the fallopian tube and the left ovary.  The hemoperitoneum was evacuated with the suction evacuator.  An Endopouch was placed into the pelvis and the right fallopian tube with  ectopic pregnancy was removed through the left port site.  Copious irrigation occurred and all old blood was removed.  There was a band-like omentum adhesion to the infraumbilical area, which was transected with a Harmonic scalpel.  Good hemostasis was  noted after lowering the pressure to 7 mmHg and pressure was then brought back up to 15 and the left port site was closed with a fascial layer of 0 Vicryl suture and the patient's abdomen was decompressed and all incisions sites were closed with  interrupted 4-0 Vicryl suture.  Intraoperative pictures were taken.  COMPLICATIONS:  There were no complications.  ESTIMATED BLOOD LOSS:  5 mL from the procedure and 200 mL hemoperitoneum.  INTRAOPERATIVE FLUIDS:  800 mL.  URINE OUTPUT:  150 mL.  DISPOSITION:  The patient was taken to recovery room in good condition.  VN/NUANCE  D:05/03/2019 T:05/04/2019 JOB:[id number]

## 2019-05-05 LAB — SURGICAL PATHOLOGY

## 2020-05-31 ENCOUNTER — Other Ambulatory Visit
Admission: RE | Admit: 2020-05-31 | Discharge: 2020-05-31 | Disposition: A | Discharge status: Home / Self Care | Payer: BC Managed Care – PPO | Source: Ambulatory Visit | Attending: Family Medicine | Admitting: Family Medicine

## 2020-05-31 DIAGNOSIS — Z3200 Encounter for pregnancy test, result unknown: Secondary | ICD-10-CM | POA: Diagnosis present

## 2020-05-31 LAB — HCG, QUANTITATIVE, PREGNANCY: hCG, Beta Chain, Quant, S: 37 m[IU]/mL — ABNORMAL HIGH (ref <5)

## 2020-08-22 ENCOUNTER — Encounter: Payer: Self-pay | Admitting: Obstetrics and Gynecology

## 2020-08-22 ENCOUNTER — Ambulatory Visit (INDEPENDENT_AMBULATORY_CARE_PROVIDER_SITE_OTHER): Payer: BC Managed Care – PPO | Admitting: Obstetrics and Gynecology

## 2020-08-22 ENCOUNTER — Other Ambulatory Visit: Payer: Self-pay

## 2020-08-22 VITALS — BP 107/71 | HR 78 | Ht 64.0 in | Wt 166.9 lb

## 2020-08-22 DIAGNOSIS — Z8759 Personal history of other complications of pregnancy, childbirth and the puerperium: Secondary | ICD-10-CM | POA: Diagnosis not present

## 2020-08-22 DIAGNOSIS — Z9889 Other specified postprocedural states: Secondary | ICD-10-CM | POA: Diagnosis not present

## 2020-08-22 NOTE — Progress Notes (Signed)
HPI:      [name] is a 35 y.o. G1P0 who LMP was Patient's last menstrual period was 08/19/2020.  Subjective:   She presents today to discuss fertility issues.  Approximately 5 years ago she had a tubal ligation performed.  She subsequently underwent tubal reversal 2 years ago.  After that she became pregnant and had an ectopic pregnancy with salpingectomy for ruptured ectopic.  She has been having unprotected intercourse since that time.  She had a positive pregnancy test in Campbell 2022 but her Quants did not rise above 100 and this subsequently resolved without intervention. She is desirous of immediate pregnancy. She is having regular menstrual cycles and regular intercourse. She is not currently interested in in vitro fertilization and would like to know if her remaining tube is occluded or narrowed.    Hx: The following portions of the patient's history were reviewed and updated as appropriate:             She  has no past medical history on file. She does not have a problem list on file. She  has a past surgical history that includes Tubal ligation; tubal ligation reversal; and Laparoscopic ovarian cystectomy (N/A, 05/03/2019). Her family history is not on file. She  reports that she has quit smoking. She has never used smokeless tobacco. She reports previous alcohol use. No history on file for drug use. She has a current medication list which includes the following prescription(s): multivitamin-prenatal and spironolactone. She is allergic to bactrim [sulfamethoxazole-trimethoprim].       Review of Systems:  Review of Systems  Constitutional: Denied constitutional symptoms, night sweats, recent illness, fatigue, fever, insomnia and weight loss.  Eyes: Denied eye symptoms, eye pain, photophobia, vision change and visual disturbance.  Ears/Nose/Throat/Neck: Denied ear, nose, throat or neck symptoms, hearing loss, nasal discharge, sinus congestion and sore throat.   Cardiovascular: Denied cardiovascular symptoms, arrhythmia, chest pain/pressure, edema, exercise intolerance, orthopnea and palpitations.  Respiratory: Denied pulmonary symptoms, asthma, pleuritic pain, productive sputum, cough, dyspnea and wheezing.  Gastrointestinal: Denied, gastro-esophageal reflux, melena, nausea and vomiting.  Genitourinary: Denied genitourinary symptoms including symptomatic vaginal discharge, pelvic relaxation issues, and urinary complaints.  Musculoskeletal: Denied musculoskeletal symptoms, stiffness, swelling, muscle weakness and myalgia.  Dermatologic: Denied dermatology symptoms, rash and scar.  Neurologic: Denied neurology symptoms, dizziness, headache, neck pain and syncope.  Psychiatric: Denied psychiatric symptoms, anxiety and depression.  Endocrine: Denied endocrine symptoms including hot flashes and night sweats.   Meds:   Current Outpatient Medications on File Prior to Visit  Medication Sig Dispense Refill  . Prenatal Vit-Fe Fumarate-FA (MULTIVITAMIN-PRENATAL) 27-0.8 MG TABS tablet Take 1 tablet by mouth daily at 12 noon.    Marland Kitchen spironolactone (ALDACTONE) 50 MG tablet Take 50 mg by mouth daily. (Patient not taking: Reported on 08/22/2020)     No current facility-administered medications on file prior to visit.       The pregnancy intention screening data noted above was reviewed. Potential methods of contraception were discussed. The patient elected to proceed with Pregnant/Seeking Pregnancy.     Objective:     Vitals:   08/22/20 0743  BP: 107/71  Pulse: 78   Filed Weights   08/22/20 0743  Weight: 166 lb 14.4 oz (75.7 kg)                Assessment:    G1P0 There are no problems to display for this patient.    1. History of ectopic pregnancy   2. History of  reversal of tubal ligation     She is at some risk for tubal occlusion/tubal narrowing of her 1 remaining tube because of her previous reanastomosis and ectopic pregnancy.  She is  likely ovulating.   Plan:            1.  Recommend HSG to further evaluate remaining fallopian tube.  2.  Discussed future work-up including hormonal, semen analysis, timing of intercourse including ovulation predictor kits etc.  3.  Based on HSG possible future referral for in vitro if necessary.  Orders Orders Placed This Encounter  Procedures  . DG Hysterogram (HSG)    No orders of the defined types were placed in this encounter.     F/U  No follow-ups on file. I spent 46 minutes involved in the care of this patient preparing to see the patient by obtaining and reviewing her medical history (including labs, imaging tests and prior procedures), documenting clinical information in the electronic health record (EHR), counseling and coordinating care plans, writing and sending prescriptions, ordering tests or procedures and directly communicating with the patient by discussing pertinent items from her history and physical exam as well as detailing my assessment and plan as noted above so that she has an informed understanding.  All of her questions were answered.  Elonda Husky, M.D. 08/22/2020 8:20 AM

## 2020-09-27 ENCOUNTER — Other Ambulatory Visit: Payer: Self-pay

## 2020-09-27 ENCOUNTER — Ambulatory Visit
Admission: RE | Admit: 2020-09-27 | Discharge: 2020-09-27 | Disposition: A | Discharge status: Home / Self Care | Payer: BC Managed Care – PPO | Source: Ambulatory Visit | Attending: Obstetrics and Gynecology | Admitting: Obstetrics and Gynecology

## 2020-09-27 DIAGNOSIS — Z9889 Other specified postprocedural states: Secondary | ICD-10-CM | POA: Insufficient documentation

## 2020-09-27 DIAGNOSIS — Z8759 Personal history of other complications of pregnancy, childbirth and the puerperium: Secondary | ICD-10-CM | POA: Insufficient documentation

## 2020-09-27 DIAGNOSIS — Z9079 Acquired absence of other genital organ(s): Secondary | ICD-10-CM | POA: Diagnosis not present

## 2020-09-27 MED ORDER — IOHEXOL 300 MG/ML  SOLN
50.0000 mL | Freq: Once | INTRAMUSCULAR | Status: AC | PRN
  Administered 2020-09-27: 25 mL

## 2020-09-30 DIAGNOSIS — Z9889 Other specified postprocedural states: Secondary | ICD-10-CM | POA: Insufficient documentation

## 2020-09-30 DIAGNOSIS — Z8759 Personal history of other complications of pregnancy, childbirth and the puerperium: Secondary | ICD-10-CM | POA: Insufficient documentation

## 2020-10-01 ENCOUNTER — Encounter: Payer: BC Managed Care – PPO | Admitting: Obstetrics and Gynecology

## 2020-10-07 ENCOUNTER — Encounter: Payer: Self-pay | Admitting: Obstetrics and Gynecology

## 2020-10-14 ENCOUNTER — Ambulatory Visit (INDEPENDENT_AMBULATORY_CARE_PROVIDER_SITE_OTHER): Payer: BC Managed Care – PPO | Admitting: Obstetrics and Gynecology

## 2020-10-14 ENCOUNTER — Encounter: Payer: Self-pay | Admitting: Obstetrics and Gynecology

## 2020-10-14 ENCOUNTER — Other Ambulatory Visit: Payer: Self-pay

## 2020-10-14 VITALS — BP 121/78 | HR 86 | Ht 63.0 in | Wt 166.5 lb

## 2020-10-14 DIAGNOSIS — Z9889 Other specified postprocedural states: Secondary | ICD-10-CM

## 2020-10-14 DIAGNOSIS — Z9189 Other specified personal risk factors, not elsewhere classified: Secondary | ICD-10-CM

## 2020-10-14 DIAGNOSIS — Z8759 Personal history of other complications of pregnancy, childbirth and the puerperium: Secondary | ICD-10-CM

## 2020-10-14 NOTE — Progress Notes (Signed)
HPI:      Ms. Ruthmary Occhipinti is a 35 y.o. G1P0 who LMP was Patient's last menstrual period was 09/21/2020.  Subjective:   She presents today to discuss fertility.  She just had an HSG performed which revealed 1 tube open and 1 tube gone.  Normal endometrial cavity. (During the HSG it seemed as if her remaining tube was blocked until just at the end of the test where it was noted to suddenly begin spilling dye).  No narrowing of the tube was noted. Of significant note patient has had a previous ectopic pregnancy after tubal reversal. Patient's partner has 2 children the youngest of which is 12. Based on cycle tracking Ms. Lisbon appears to be ovulating monthly usually on day 16-18.    Hx: The following portions of the patient's history were reviewed and updated as appropriate:             She  has no past medical history on file. She does not have any pertinent problems on file. She  has a past surgical history that includes Tubal ligation; tubal ligation reversal; and Laparoscopic ovarian cystectomy (N/A, 05/03/2019). Her family history is not on file. She  reports that she has quit smoking. She has never used smokeless tobacco. She reports previous alcohol use. No history on file for drug use. She has a current medication list which includes the following prescription(s): multivitamin-prenatal and spironolactone. She is allergic to bactrim [sulfamethoxazole-trimethoprim].       Review of Systems:  Review of Systems  Constitutional: Denied constitutional symptoms, night sweats, recent illness, fatigue, fever, insomnia and weight loss.  Eyes: Denied eye symptoms, eye pain, photophobia, vision change and visual disturbance.  Ears/Nose/Throat/Neck: Denied ear, nose, throat or neck symptoms, hearing loss, nasal discharge, sinus congestion and sore throat.  Cardiovascular: Denied cardiovascular symptoms, arrhythmia, chest pain/pressure, edema, exercise intolerance, orthopnea and palpitations.   Respiratory: Denied pulmonary symptoms, asthma, pleuritic pain, productive sputum, cough, dyspnea and wheezing.  Gastrointestinal: Denied, gastro-esophageal reflux, melena, nausea and vomiting.  Genitourinary: Denied genitourinary symptoms including symptomatic vaginal discharge, pelvic relaxation issues, and urinary complaints.  Musculoskeletal: Denied musculoskeletal symptoms, stiffness, swelling, muscle weakness and myalgia.  Dermatologic: Denied dermatology symptoms, rash and scar.  Neurologic: Denied neurology symptoms, dizziness, headache, neck pain and syncope.  Psychiatric: Denied psychiatric symptoms, anxiety and depression.  Endocrine: Denied endocrine symptoms including hot flashes and night sweats.   Meds:   Current Outpatient Medications on File Prior to Visit  Medication Sig Dispense Refill  . Prenatal Vit-Fe Fumarate-FA (MULTIVITAMIN-PRENATAL) 27-0.8 MG TABS tablet Take 1 tablet by mouth daily at 12 noon.    Marland Kitchen spironolactone (ALDACTONE) 50 MG tablet Take 50 mg by mouth daily. (Patient not taking: No sig reported)     No current facility-administered medications on file prior to visit.       The pregnancy intention screening data noted above was reviewed. Potential methods of contraception were discussed. The patient elected to proceed with Pregnant/Seeking Pregnancy.     Objective:     Vitals:   10/14/20 0848  BP: 121/78  Pulse: 86   Filed Weights   10/14/20 0848  Weight: 166 lb 8 oz (75.5 kg)              HSG results reviewed directly with the patient  Assessment:    G1P0 Patient Active Problem List   Diagnosis Date Noted  . History of reversal of tubal ligation   . History of ectopic pregnancy  1. History of ectopic pregnancy   2. History of reversal of tubal ligation   3. At risk for fertility problems     Patient physically intact and normal with exception of a surgically removed fallopian tube.  She seems to be ovulating  monthly.   Plan:            1.  Partner for semen analysis  2.  Ovulation predictor kits  3.  Continue prenatal vitamins  4.  Patient to contact us when she desires further work-up for infertility if she does not become pregnant. Orders No orders of the defined types were placed in this encounter.   No orders of the defined types were placed in this encounter.     F/U  No follow-ups on file. I spent 23 minutes involved in the care of this patient preparing to see the patient by obtaining and reviewing her medical history (including labs, imaging tests and prior procedures), documenting clinical information in the electronic health record (EHR), counseling and coordinating care plans, writing and sending prescriptions, ordering tests or procedures and directly communicating with the patient by discussing pertinent items from her history and physical exam as well as detailing my assessment and plan as noted above so that she has an informed understanding.  All of her questions were answered.  Elonda Husky, M.D. 10/14/2020 9:29 AM

## 2021-01-24 ENCOUNTER — Other Ambulatory Visit: Payer: Self-pay

## 2021-01-24 ENCOUNTER — Ambulatory Visit (INDEPENDENT_AMBULATORY_CARE_PROVIDER_SITE_OTHER): Payer: BC Managed Care – PPO | Admitting: Internal Medicine

## 2021-01-24 ENCOUNTER — Encounter: Payer: Self-pay | Admitting: Internal Medicine

## 2021-01-24 VITALS — BP 98/60 | HR 79 | Temp 98.2°F | Resp 20 | Ht 63.78 in | Wt 163.6 lb

## 2021-01-24 DIAGNOSIS — J301 Allergic rhinitis due to pollen: Secondary | ICD-10-CM | POA: Insufficient documentation

## 2021-01-24 DIAGNOSIS — R140 Abdominal distension (gaseous): Secondary | ICD-10-CM | POA: Diagnosis not present

## 2021-01-24 DIAGNOSIS — T781XXA Other adverse food reactions, not elsewhere classified, initial encounter: Secondary | ICD-10-CM | POA: Insufficient documentation

## 2021-01-24 NOTE — Patient Instructions (Addendum)
Food Intolerance:  - you do not have any symptoms concerning for anaphylaxis or severe allergic reactions to foods, your symptoms are more consistent with possibly food intolerance vs other gastrointestinal cause - you are sensitized to the following foods: walnut, almond, hazelnut, salmon, flounder, codfish, onion, cabbage, strawberry and pineapple-since you eat these regularly without symptoms, this rules out a TRUE food allergy, but you can use this list to see if eliminating these foods will help your symptoms - follow food elimination diet as below - will check celiac labs as well (gluten allergy) - if you do not improve with the above regimen, consider GI referral   Allergic Rhinitis: - allergy testing today was positive to grasses and mice only - Continue over the counter antihistamine.  Your options include Zyrtec (Cetirizine) 10mg , Claritin (Loratadine) 10mg , Allegra (Fexofenadine) 180mg , or Xyzal (Levocetirinze) 5mg  daily. - if this does not control symptoms, can add a daily nasal spray like Flonase (fluticasone) 2 sprays in each nostril daily.   Food Elimination Diet Instructions - keep a food diary at least 2-4 weeks - make note if any foods consistently make symptoms worse  - if so, eliminate these foods from your diet for 2-4 weeks.   - If symptoms do not improve, stop food elimination diet.   - If symptoms do improve, then one by one reintroduce foods and make note if symptoms return.   - If they return following reintroduction, consider eliminating or reducing intake of each specific food causing symptoms.

## 2021-01-24 NOTE — Progress Notes (Signed)
NEW PATIENT Date of Service/Encounter:  01/24/21 Referring provider: Jettie Pagan, NP Primary care provider: Jettie Pagan, NP  Subjective:  Kelsey Campbell is a 35 y.o. female with a PMHx of constipation presenting today for evaluation of abdominal bloating. History obtained from: chart review and patient.   She is having ongoing 3 years intermittent abdominal cramping and bloating.  Sometimes feels bubbles in her stomach and gas that lasts all day.  Denies other systemic symptoms.  No vomiting, diarrhea or nausea.  She has not been evaluated by a gastroenterologist.  She does get constipation, but this has improved with daily Miralax. Progressively worsened in this time frame.  No weight loss.  No joint pains or swelling.  No fever.  Occasional fatigue, but not excessive.   She was using a tea "ballerina tea" that helped her go to the bathroom regularly. No identifiable triggers.  She will get occasional red bumps on her arms but they don't stay very long.  She does take a lot of vitamins-currently taking multivitamin and a prenatal.   She will get seasonal ocular and nasal symptoms including sinus headaches.  This happens most of her life.  She uses nasal spray in the Spring and OTC AH as needed which works well.   She eats a low carb diet.  Has tried keto diet and lost 17 lbs with this.  Now on low carb diet, but not high fat and sugar.  Not specifically avoiding any particular food.  Other allergy screening: Asthma: no Rhino conjunctivitis: no Food allergy: no Medication allergy: yes-Bactrim caused boils on her face that led to scarring Hymenoptera allergy: no Urticaria: no Eczema:no History of recurrent infections suggestive of immunodeficency: no Vaccinations are up to date.   Past Medical History: History reviewed. No pertinent past medical history. Medication List:  Current Outpatient Medications  Medication Sig Dispense Refill   fluticasone (FLONASE) 50  MCG/ACT nasal spray Place 1 spray into both nostrils as needed (only use in the spring).     polyethylene glycol powder (GLYCOLAX/MIRALAX) 17 GM/SCOOP powder Take by mouth as needed.     Prenatal Vit-Fe Fumarate-FA (MULTIVITAMIN-PRENATAL) 27-0.8 MG TABS tablet Take 1 tablet by mouth daily at 12 noon.     No current facility-administered medications for this visit.   Known Allergies:  Allergies  Allergen Reactions   Bactrim [Sulfamethoxazole-Trimethoprim] Itching   Past Surgical History: Past Surgical History:  Procedure Laterality Date   LAPAROSCOPIC OVARIAN CYSTECTOMY N/A 05/03/2019   Procedure: LAPAROSCOPIC SALPINGECTOMY;  Surgeon: Schermerhorn, Ihor Austin, MD;  Location: [facility] ORS;  Service: Gynecology;  Laterality: N/A;   TUBAL LIGATION     tubal ligation reversal     Family History: Family History  Problem Relation Age of Onset   Asthma Sister    Eczema Paternal Grandmother    Social History: [place] lives in a 37 year old home, with vinyl plank floors, electric heating, central AC, 2 dogs, no cockroaches, no dust mite covers, works as a Mining engineer, no smoke exposure, [place] not [place] near interstate/industrial area, using HEPA filter.  ROS:  All other systems negative except as noted per HPI.  Objective:  Blood pressure 98/60, pulse 79, temperature 98.2 F (36.8 C), temperature source Tympanic, resp. rate 20, height 5' 3.78" (1.62 m), weight 163 lb 9.6 oz (74.2 kg), SpO2 98 %, unknown if currently breastfeeding. Body mass index is 28.28 kg/m. Physical Exam:  General Appearance:  Alert, cooperative, no distress, appears stated age  Head:  Normocephalic,  without obvious abnormality, atraumatic  Eyes:  Conjunctiva clear, EOM's intact  Nose: Nares normal, turbinates normal  Throat: Lips, tongue normal; teeth and gums normal, normal posterior oropharynx  Neck: Supple, symmetrical  Lungs:   CTAB, no wheezing, Respirations unlabored, no coughing  Heart:  RRR, no murmur,  Appears well perfused  Extremities: No edema  Skin: Skin color, texture, turgor normal, no rashes or lesions on visualized portions of skin  Neurologic: No gross deficits   Diagnostics:   Skin Testing: Environmental allergy panel and select foods. Results discussed with patient/family.  Airborne Adult Perc - 01/24/21 1631     Time Antigen Placed 0345    Allergen Manufacturer Waynette ButteryGreer    Location Back    Number of Test 59    Panel 1 Select    1. Control-Buffer 50% Glycerol Negative    2. Control-Histamine 1 mg/ml 3+    3. Albumin saline Negative    4. Bahia Negative    5. French Southern Territ[place] 3+    6. Johnson Negative    7. Kentucky Blue 3+    8. Meadow Fescue Negative    9. Perennial Rye Negative    10. Sweet Vernal Negative    11. Timothy Negative    12. Cocklebur Negative    13. Burweed Marshelder Negative    14. Ragweed, short Negative    15. Ragweed, Giant Negative    16. Plantain,  English Negative    17. Lamb's Quarters Negative    18. Sheep Sorrell Negative    19. Rough Pigweed Negative    20. Marsh Elder, Rough Negative    21. Mugwort, Common Negative    22. Ash mix Negative    23. Birch mix Negative    24. Beech American Negative    25. Box, Elder Negative    26. Cedar, red Negative    27. Cottonwood, Guinea-BissauEastern Negative    28. Elm mix Negative    29. Hickory Negative    30. Maple mix Negative    31. Oak, Guinea-Bissau[place]astern mix Negative    32. Pecan Pollen Negative    33. Pine mix Negative    34. Sycamore Eastern Negative    35. Walnut, Black Pollen Negative    36. Alternaria alternata Negative    37. Cladosporium Herbarum Negative    38. Aspergillus mix Negative    39. Penicillium mix Negative    40. Bipolaris sorokiniana (Helminthosporium) Negative    41. Drechslera spicifera (Curvularia) Negative    42. Mucor plumbeus Negative    43. Fusarium moniliforme Negative    44. Aureobasidium pullulans (pullulara) Negative    45. Rhizopus oryzae Negative    46. Botrytis cinera  Negative    47. Epicoccum nigrum Negative    48. Phoma betae Negative    49. Candida Albicans Negative    50. Trichophyton mentagrophytes Negative    51. Mite, D Farinae  5,000 AU/ml Negative    52. Mite, D Pteronyssinus  5,000 AU/ml Negative    53. Cat Hair 10,000 BAU/ml Negative    54.  Dog Epithelia Negative    55. Mixed Feathers Negative    56. Horse Epithelia Negative    57. Cockroach, German Negative    58. Mouse 3+    59. Tobacco Leaf Negative             Food Adult Perc - 01/24/21 1600     Time Antigen Placed 0345    Allergen Manufacturer Waynette ButteryGreer    Location Back  Number of allergen test 72    1. Peanut Negative    2. Soybean Negative    3. Wheat Negative    4. Sesame Negative    5. Milk, cow Negative    6. Egg White, Chicken Negative    7. Casein Negative    8. Shellfish Mix Negative    9. Fish Mix Negative    10. Cashew Negative    11. Pecan Food Negative    12. Walnut Food 3+    13. Almond 3+    14. Hazelnut 3+    15. [place] nut Negative    16. Coconut Negative    17. Pistachio Negative    18. Catfish Negative    19. Bass Negative    20. Trout Negative    21. Tuna Negative    22. Salmon 3+    23. Flounder 3+    24. Codfish 3+    25. Shrimp Negative    26. Crab Negative    27. Lobster Negative    28. Oyster Negative    29. Scallops Negative    30. Barley Negative    31. Oat  Negative    32. Rye  Negative    33. Hops Negative    34. Rice Negative    35. Cottonseed Negative    36. Saccharomyces Cerevisiae  Negative    37. Pork Negative    38. Malawi Meat Negative    39. Chicken Meat Negative    40. Beef Negative    41. Lamb Negative    42. Tomato Negative    43. White Potato Negative    44. Sweet Potato Negative    45. Pea, Green/English Negative    46. Navy Bean Negative    47. Mushrooms Negative    48. Avocado Negative    49. Onion 3+    50. Cabbage 3+    51. Carrots Negative    52. Celery Negative    53. Corn Negative    54.  Cucumber Negative    55. Grape (White seedless) Negative    56. Orange  Negative    57. Banana Negative    58. Apple Negative    59. Peach Negative    60. Strawberry 3+    61. Cantaloupe Negative    62. Watermelon Negative    63. Pineapple 3+    64. Chocolate/Cacao bean Negative    65. Karaya Gum Negative    66. Acacia (Arabic Gum) Negative    67. Cinnamon Negative    68. Nutmeg Negative    69. Ginger Negative    70. Garlic Negative    71. Pepper, black Negative    72. Mustard Negative             Allergy testing results were read and interpreted by myself, documented by clinical staff.  Assessment:  Abdominal bloating - suspect food intolerance Discussed that her symptoms are not consistent with IgE mediated food allergy and more consistent with either underlying GI disorder or possible food intolerance though she couldn't identify any specific triggers.   We discussed limitations of food allergy testing in this setting-specifically that any positives would represent sensitivity and not a true allergy since she lacks any IgE mediated symptoms. Additionally, food sensitivity does not necessarily correlate with symptoms, so unclear if using this as a guide for food elimination will be helpful. Patient would like to undergo testing as a tool for elimination dieting.  Her testing was positive for  multiple foods including some finned fish, tree nuts, onions, strawberry, pineapple, and cabbage.  She was questioned again on these specific foods, and states that she consumes these on a regular basis-particularly salmon, tree nuts, onions, strawberry and cabbage.  She again denies any symptoms compatible with an IgE mediated reaction after eating these foods, making these false positives.  Gold standard diagnosis for food allergy is oral challenge which she has been doing regularly with these foods.  She would like to consider elimination diet as detailed below with these foods.  Prolonged  avoidance of these foods in a sensitized person can rarely result in food allergy.  Plan/Recommendations:   Patient Instructions  Food Intolerance:  - you do not have any symptoms concerning for anaphylaxis or severe allergic reactions to foods, your symptoms are more consistent with possibly food intolerance vs other gastrointestinal cause - you are sensitized to the following foods: walnut, almond, hazelnut, salmon, flounder, codfish, onion, cabbage, strawberry and pineapple-since you eat these regularly without symptoms, this rules out a TRUE food allergy, but you can use this list to see if eliminating these foods will help your symptoms - follow food elimination diet as below - will check celiac labs as well (gluten allergy) - if you do not improve with the above regimen, consider GI referral   Allergic Rhinitis: - allergy testing today was positive to grasses and mice only - Continue over the counter antihistamine.  Your options include Zyrtec (Cetirizine) 10mg , Claritin (Loratadine) 10mg , Allegra (Fexofenadine) 180mg , or Xyzal (Levocetirinze) 5mg  daily. - if this does not control symptoms, can add a daily nasal spray like Flonase (fluticasone) 2 sprays in each nostril daily.   Food Elimination Diet Instructions - keep a food diary at least 2-4 weeks - make note if any foods consistently make symptoms worse  - if so, eliminate these foods from your diet for 2-4 weeks.   - If symptoms do not improve, stop food elimination diet.   - If symptoms do improve, then one by one reintroduce foods and make note if symptoms return.   - If they return following reintroduction, consider eliminating or reducing intake of each specific food causing symptoms.    Follow-up in 3 months.   This note in its entirety was forwarded to the Provider who requested this consultation.  Thank you for your kind referral. I appreciate the opportunity to take part in Timica's care. Please do not hesitate to  contact me with questions.  Sincerely,  , MD [facility]

## 2021-04-29 ENCOUNTER — Ambulatory Visit: Payer: BC Managed Care – PPO | Admitting: Family Medicine

## 2021-04-29 DIAGNOSIS — J309 Allergic rhinitis, unspecified: Secondary | ICD-10-CM

## 2021-04-29 NOTE — Patient Instructions (Incomplete)
Allergic rhinitis Continue allergen avoidance measures directed toward grass pollen and mouse Continue an antihistamine once a day as needed for runny nose or itch. Remember to rotate to a different antihistamine about every 3 months. Some examples of over the counter antihistamines include Zyrtec (cetirizine), Xyzal (levocetirizine), Allegra (fexofenadine), and Claritin (loratidine).  Continue Flonase 1 to 2 sprays in each nostril once a day as needed for stuffy nose. In the right nostril, point the applicator out toward the right ear. In the left nostril, point the applicator out toward the left ear Consider saline nasal rinses as needed for nasal symptoms. Use this before any medicated nasal sprays for best result  Food sensitivity  Call the clinic if this treatment plan is not working well for you.  Follow up in *** or sooner if needed.

## 2021-04-29 NOTE — Progress Notes (Deleted)
   [address] [place] [address] Dept: [phone]  FOLLOW UP NOTE  Patient ID: Kelsey Campbell, female    DOB: 1986-01-14  Age: 35 y.o. MRN: [id number] Date of Office Visit: 04/29/2021  Assessment  Chief Complaint: No chief complaint on file.  HPI Kelsey Campbell    Drug Allergies:  Allergies  Allergen Reactions   Bactrim [Sulfamethoxazole-Trimethoprim] Itching    Physical Exam: There were no vitals taken for this visit.   Physical Exam  Diagnostics:    Assessment and Plan: No diagnosis found.  No orders of the defined types were placed in this encounter.   There are no Patient Instructions on file for this visit.  No follow-ups on file.    Thank you for the opportunity to care for this patient.  Please do not hesitate to contact me with questions.  Thermon Leyland, FNP [facility]

## 2022-01-15 NOTE — Progress Notes (Signed)
Pt presents for pregnancy confirmation done on 01/16/2022, UPT POS. [id number]. Pt states she is currently taking PNV. EDD 09/08/22 based on LMP/US of 12/02/21. [redacted]w[redacted]d. Pt to schedule f/u nurse visit, labs, etc. with front desk at checkout. All questions answered.

## 2022-01-16 ENCOUNTER — Ambulatory Visit (INDEPENDENT_AMBULATORY_CARE_PROVIDER_SITE_OTHER): Payer: BC Managed Care – PPO | Admitting: Obstetrics and Gynecology

## 2022-01-16 ENCOUNTER — Other Ambulatory Visit: Payer: Self-pay

## 2022-01-16 VITALS — Ht 64.0 in | Wt 163.0 lb

## 2022-01-16 DIAGNOSIS — Z8759 Personal history of other complications of pregnancy, childbirth and the puerperium: Secondary | ICD-10-CM

## 2022-01-16 DIAGNOSIS — Z9889 Other specified postprocedural states: Secondary | ICD-10-CM

## 2022-01-16 DIAGNOSIS — Z3200 Encounter for pregnancy test, result unknown: Secondary | ICD-10-CM | POA: Diagnosis not present

## 2022-01-16 LAB — POCT URINE PREGNANCY: Preg Test, Ur: POSITIVE — AB

## 2022-01-26 DIAGNOSIS — Z130 Encounter for screening for diseases of the blood and blood-forming organs and certain disorders involving the immune mechanism: Secondary | ICD-10-CM | POA: Diagnosis not present

## 2022-01-26 DIAGNOSIS — Z3A08 8 weeks gestation of pregnancy: Secondary | ICD-10-CM | POA: Diagnosis not present

## 2022-01-26 DIAGNOSIS — Z13220 Encounter for screening for lipoid disorders: Secondary | ICD-10-CM | POA: Diagnosis not present

## 2022-01-26 DIAGNOSIS — S6992XA Unspecified injury of left wrist, hand and finger(s), initial encounter: Secondary | ICD-10-CM | POA: Diagnosis not present

## 2022-01-26 DIAGNOSIS — Z0000 Encounter for general adult medical examination without abnormal findings: Secondary | ICD-10-CM | POA: Diagnosis not present

## 2022-01-27 ENCOUNTER — Ambulatory Visit (INDEPENDENT_AMBULATORY_CARE_PROVIDER_SITE_OTHER): Payer: BC Managed Care – PPO

## 2022-01-27 DIAGNOSIS — Z3480 Encounter for supervision of other normal pregnancy, unspecified trimester: Secondary | ICD-10-CM

## 2022-01-27 DIAGNOSIS — Z3490 Encounter for supervision of normal pregnancy, unspecified, unspecified trimester: Secondary | ICD-10-CM | POA: Insufficient documentation

## 2022-01-27 DIAGNOSIS — Z3689 Encounter for other specified antenatal screening: Secondary | ICD-10-CM

## 2022-01-27 DIAGNOSIS — Z369 Encounter for antenatal screening, unspecified: Secondary | ICD-10-CM

## 2022-01-27 NOTE — Progress Notes (Signed)
New OB Intake  I connected with  Kelsey Campbell on 01/27/22 at  3:15 PM EDT by telephone Video Visit and verified that I am speaking with the correct person using two identifiers. Nurse is located at [facility] and pt is located at car.  I explained I am completing New OB Intake today. We discussed her EDD of 09/08/2022 that is based on LMP of approx 12/02/21. Pt is G4/P2012. I reviewed her allergies, medications, Medical/Surgical/OB history, and appropriate screenings. Based on history, this is a/an pregnancy uncomplicated .   Patient Active Problem List   Diagnosis Date Noted   Supervision of other normal pregnancy, antepartum 01/27/2022   Abdominal bloating 01/24/2021   Adverse food reaction 01/24/2021   Seasonal allergic rhinitis due to pollen 01/24/2021   History of reversal of tubal ligation    History of ectopic pregnancy     Concerns addressed today None  Delivery Plans:  Plans to deliver at [facility].  Anatomy US Explained first scheduled [place] will be tomorrow.  Anatomy scan at 20 weeks.  Labs Discussed genetic screening with patient. Patient desires genetic testing to be drawn with new OB labs. Discussed possible labs to be drawn at new OB appointment.  COVID Vaccine Patient has had COVID vaccine.   Social Determinants of Health Food Insecurity: denies food insecurity Transportation: Patient denies transportation needs. Childcare: Discussed no children allowed at ultrasound appointments.   First visit review I reviewed new OB appt with pt. I explained she will have ob bloodwork and pap smear/pelvic exam if indicated. Explained pt will be seen by Dr. Brennan Bailey at first visit; encounter routed to appropriate provider.   Loran Senters, [facility] 01/27/2022  3:40 PM  Clinical Staff Provider  Office Location  [facility] Dating    Language  English Anatomy US    Flu Vaccine  offer Genetic Screen  NIPS:   TDaP vaccine   offer Hgb A1C or  GTT  Early : Third trimester :   Covid One booster   LAB RESULTS   Rhogam   Blood Type     Feeding Plan breast Antibody    Contraception vasectomy Rubella    Circumcision yes RPR     Pediatrician  undecided HBsAg     Support Person Robert HIV    Prenatal Classes yes Varicella     GBS  (For PCN allergy, check sensitivities)   BTL Consent  Hep C   VBAC Consent  Pap      Hgb Electro      CF      SMA

## 2022-01-28 ENCOUNTER — Ambulatory Visit (INDEPENDENT_AMBULATORY_CARE_PROVIDER_SITE_OTHER): Payer: BC Managed Care – PPO

## 2022-01-28 DIAGNOSIS — Z3687 Encounter for antenatal screening for uncertain dates: Secondary | ICD-10-CM

## 2022-01-28 DIAGNOSIS — Z3A01 Less than 8 weeks gestation of pregnancy: Secondary | ICD-10-CM | POA: Diagnosis not present

## 2022-01-28 DIAGNOSIS — Z8759 Personal history of other complications of pregnancy, childbirth and the puerperium: Secondary | ICD-10-CM

## 2022-01-28 DIAGNOSIS — Z9889 Other specified postprocedural states: Secondary | ICD-10-CM

## 2022-01-28 DIAGNOSIS — Z3200 Encounter for pregnancy test, result unknown: Secondary | ICD-10-CM

## 2022-02-04 ENCOUNTER — Encounter: Payer: Self-pay | Admitting: Obstetrics and Gynecology

## 2022-02-09 ENCOUNTER — Other Ambulatory Visit: Payer: BC Managed Care – PPO

## 2022-02-09 DIAGNOSIS — S6992XA Unspecified injury of left wrist, hand and finger(s), initial encounter: Secondary | ICD-10-CM | POA: Diagnosis not present

## 2022-02-10 ENCOUNTER — Other Ambulatory Visit: Payer: BC Managed Care – PPO

## 2022-02-16 ENCOUNTER — Other Ambulatory Visit: Payer: BC Managed Care – PPO

## 2022-02-16 ENCOUNTER — Other Ambulatory Visit: Payer: Self-pay

## 2022-02-16 DIAGNOSIS — Z369 Encounter for antenatal screening, unspecified: Secondary | ICD-10-CM | POA: Diagnosis not present

## 2022-02-16 DIAGNOSIS — Z113 Encounter for screening for infections with a predominantly sexual mode of transmission: Secondary | ICD-10-CM | POA: Diagnosis not present

## 2022-02-16 DIAGNOSIS — Z3480 Encounter for supervision of other normal pregnancy, unspecified trimester: Secondary | ICD-10-CM

## 2022-02-16 DIAGNOSIS — Z3A10 10 weeks gestation of pregnancy: Secondary | ICD-10-CM

## 2022-02-17 LAB — URINALYSIS, ROUTINE W REFLEX MICROSCOPIC
Bilirubin, UA: NEGATIVE
Glucose, UA: NEGATIVE
Ketones, UA: NEGATIVE
Leukocytes,UA: NEGATIVE
Nitrite, UA: NEGATIVE
Protein,UA: NEGATIVE
RBC, UA: NEGATIVE
Specific Gravity, UA: 1.005 (ref 1.005–1.030)
Urobilinogen, Ur: 0.2 mg/dL (ref 0.2–1.0)
pH, UA: 6.5 (ref 5.0–7.5)

## 2022-02-18 LAB — GC/CHLAMYDIA PROBE AMP
Chlamydia trachomatis, NAA: NEGATIVE
Neisseria Gonorrhoeae by PCR: NEGATIVE

## 2022-02-18 LAB — URINE CULTURE, OB REFLEX

## 2022-02-18 LAB — CULTURE, OB URINE

## 2022-02-19 LAB — NICOTINE SCREEN, URINE: Cotinine Ql Scrn, Ur: NEGATIVE ng/mL

## 2022-02-19 LAB — PAIN MGT SCRN (14 DRUGS), UR
Amphetamine Scrn, Ur: NEGATIVE ng/mL
BARBITURATE SCREEN URINE: NEGATIVE ng/mL
BENZODIAZEPINE SCREEN, URINE: NEGATIVE ng/mL
Buprenorphine, Urine: NEGATIVE ng/mL
CANNABINOIDS UR QL SCN: NEGATIVE ng/mL
Cocaine (Metab) Scrn, Ur: NEGATIVE ng/mL
Creatinine(Crt), U: 17 mg/dL — ABNORMAL LOW (ref 20.0–300.0)
Fentanyl, Urine: NEGATIVE pg/mL
Meperidine Screen, Urine: NEGATIVE ng/mL
Methadone Screen, Urine: NEGATIVE ng/mL
OXYCODONE+OXYMORPHONE UR QL SCN: NEGATIVE ng/mL
Opiate Scrn, Ur: NEGATIVE ng/mL
Ph of Urine: 6.2 (ref 4.5–8.9)
Phencyclidine Qn, Ur: NEGATIVE ng/mL
Propoxyphene Scrn, Ur: NEGATIVE ng/mL
Tramadol Screen, Urine: NEGATIVE ng/mL

## 2022-02-19 LAB — SPECIFIC GRAVITY (REFLEXED): SPECIFIC GRAVITY: 1.0034

## 2022-02-25 ENCOUNTER — Ambulatory Visit (INDEPENDENT_AMBULATORY_CARE_PROVIDER_SITE_OTHER): Payer: BC Managed Care – PPO | Admitting: Obstetrics and Gynecology

## 2022-02-25 ENCOUNTER — Other Ambulatory Visit (HOSPITAL_COMMUNITY)
Admission: RE | Admit: 2022-02-25 | Discharge: 2022-02-25 | Disposition: A | Discharge status: Home / Self Care | Payer: BC Managed Care – PPO | Source: Ambulatory Visit | Attending: Obstetrics and Gynecology | Admitting: Obstetrics and Gynecology

## 2022-02-25 ENCOUNTER — Encounter: Payer: Self-pay | Admitting: Obstetrics and Gynecology

## 2022-02-25 VITALS — BP 110/70 | HR 61 | Wt 167.7 lb

## 2022-02-25 DIAGNOSIS — Z3481 Encounter for supervision of other normal pregnancy, first trimester: Secondary | ICD-10-CM

## 2022-02-25 DIAGNOSIS — Z124 Encounter for screening for malignant neoplasm of cervix: Secondary | ICD-10-CM | POA: Insufficient documentation

## 2022-02-25 DIAGNOSIS — O09521 Supervision of elderly multigravida, first trimester: Secondary | ICD-10-CM

## 2022-02-25 DIAGNOSIS — Z3A12 12 weeks gestation of pregnancy: Secondary | ICD-10-CM | POA: Diagnosis not present

## 2022-02-25 LAB — CBC/D/PLT+RPR+RH+ABO+RUBIGG...
Antibody Screen: NEGATIVE
Basophils Absolute: 0 10*3/uL (ref 0.0–0.2)
Basos: 0 %
EOS (ABSOLUTE): 0 10*3/uL (ref 0.0–0.4)
Eos: 1 %
HCV Ab: NONREACTIVE
HIV Screen 4th Generation wRfx: NONREACTIVE
Hematocrit: 35 % (ref 34.0–46.6)
Hemoglobin: 11.6 g/dL (ref 11.1–15.9)
Hepatitis B Surface Ag: NEGATIVE
Immature Grans (Abs): 0 10*3/uL (ref 0.0–0.1)
Immature Granulocytes: 0 %
Lymphocytes Absolute: 1.6 10*3/uL (ref 0.7–3.1)
Lymphs: 23 %
MCH: 32.1 pg (ref 26.6–33.0)
MCHC: 33.1 g/dL (ref 31.5–35.7)
MCV: 97 fL (ref 79–97)
Monocytes Absolute: 0.5 10*3/uL (ref 0.1–0.9)
Monocytes: 7 %
Neutrophils Absolute: 4.7 10*3/uL (ref 1.4–7.0)
Neutrophils: 69 %
Platelets: 257 10*3/uL (ref 150–450)
RBC: 3.61 x10E6/uL — ABNORMAL LOW (ref 3.77–5.28)
RDW: 11.8 % (ref 11.7–15.4)
RPR Ser Ql: NONREACTIVE
Rh Factor: POSITIVE
Rubella Antibodies, IGG: 2.09 index (ref >0.99)
Varicella zoster IgG: 2532 index (ref >165)
WBC: 6.8 10*3/uL (ref 3.4–10.8)

## 2022-02-25 LAB — MATERNIT 21 PLUS CORE, BLOOD
Fetal Fraction: 10
Result (T21): NEGATIVE
Trisomy 13 (Patau syndrome): NEGATIVE
Trisomy 18 (Edwards syndrome): NEGATIVE
Trisomy 21 (Down syndrome): NEGATIVE

## 2022-02-25 LAB — POCT URINALYSIS DIPSTICK OB
Bilirubin, UA: NEGATIVE
Blood, UA: NEGATIVE
Glucose, UA: NEGATIVE
Ketones, UA: NEGATIVE
Leukocytes, UA: NEGATIVE
Nitrite, UA: NEGATIVE
POC,PROTEIN,UA: NEGATIVE
Spec Grav, UA: 1.01 (ref 1.010–1.025)
Urobilinogen, UA: 0.2 E.U./dL
pH, UA: 6 (ref 5.0–8.0)

## 2022-02-25 LAB — HCV INTERPRETATION

## 2022-02-25 NOTE — Progress Notes (Signed)
NOB: Denies problems.  Taking prenatal vitamins as directed.  We will begin ASA 81 mg.  Pap performed.  aFP next visit.  Physical examination General NAD, Conversant  HEENT Atraumatic; Op clear with mmm.  Normo-cephalic. Pupils reactive. Anicteric sclerae  Thyroid/Neck Smooth without nodularity or enlargement. Normal ROM.  Neck Supple.  Skin No rashes, lesions or ulceration. Normal palpated skin turgor. No nodularity.  Breasts: No masses or discharge.  Symmetric.  No axillary adenopathy.  Lungs: Clear to auscultation.No rales or wheezes. Normal Respiratory effort, no retractions.  Heart: NSR.  No murmurs or rubs appreciated. No periferal edema  Abdomen: Soft.  Non-tender.  No masses.  No HSM. No hernia  Extremities: Moves all appropriately.  Normal ROM for age. No lymphadenopathy.  Neuro: Oriented to PPT.  Normal mood. Normal affect.     Pelvic:   Vulva: Normal appearance.  No lesions.  Vagina: No lesions or abnormalities noted.  Support: Normal pelvic support.  Urethra No masses tenderness or scarring.  Meatus Normal size without lesions or prolapse.  Cervix: Normal appearance.  No lesions.  Anus: Normal exam.  No lesions.  Perineum: Normal exam.  No lesions.        Bimanual   Adnexae: No masses.  Non-tender to palpation.  Uterus: Enlarged. 13wks  163 bpm  Non-tender.  Mobile.  AV.  Adnexae: No masses.  Non-tender to palpation.  Cul-de-sac: Negative for abnormality.  Adnexae: No masses.  Non-tender to palpation.         Pelvimetry   Diagonal: Reached.  Spines: Average.  Sacrum: Concave.  Pubic Arch: Normal.   I spent 31 minutes involved in the care of this patient preparing to see the patient by obtaining and reviewing her medical history (including labs, imaging tests and prior procedures), documenting clinical information in the electronic health record (EHR), counseling and coordinating care plans, writing and sending prescriptions, ordering tests or procedures and in direct  communicating with the patient and medical staff discussing pertinent items from her history and physical exam.

## 2022-02-25 NOTE — Progress Notes (Signed)
Patient presents today for New OB physical. [id number]. Patient states having a metallic taste in her mouth the last 2-3 days. Patient is due for her pap smear. MAT21 is negative. She states no other questions or concerns at this time.

## 2022-03-02 LAB — CYTOLOGY - PAP
Comment: NEGATIVE
Diagnosis: NEGATIVE
High risk HPV: NEGATIVE

## 2022-03-17 ENCOUNTER — Telehealth: Payer: Self-pay

## 2022-03-26 ENCOUNTER — Ambulatory Visit (INDEPENDENT_AMBULATORY_CARE_PROVIDER_SITE_OTHER): Payer: BC Managed Care – PPO | Admitting: Obstetrics and Gynecology

## 2022-03-26 ENCOUNTER — Encounter: Payer: Self-pay | Admitting: Obstetrics and Gynecology

## 2022-03-26 VITALS — BP 111/73 | HR 72 | Wt 174.0 lb

## 2022-03-26 DIAGNOSIS — Z23 Encounter for immunization: Secondary | ICD-10-CM | POA: Diagnosis not present

## 2022-03-26 DIAGNOSIS — O09522 Supervision of elderly multigravida, second trimester: Secondary | ICD-10-CM | POA: Diagnosis not present

## 2022-03-26 DIAGNOSIS — Z3A16 16 weeks gestation of pregnancy: Secondary | ICD-10-CM

## 2022-03-26 DIAGNOSIS — Z3689 Encounter for other specified antenatal screening: Secondary | ICD-10-CM

## 2022-03-26 DIAGNOSIS — Z3480 Encounter for supervision of other normal pregnancy, unspecified trimester: Secondary | ICD-10-CM

## 2022-03-26 LAB — POCT URINALYSIS DIPSTICK OB
Bilirubin, UA: NEGATIVE
Blood, UA: NEGATIVE
Glucose, UA: NEGATIVE
Ketones, UA: NEGATIVE
Leukocytes, UA: NEGATIVE
Nitrite, UA: NEGATIVE
POC,PROTEIN,UA: NEGATIVE
Spec Grav, UA: 1.02 (ref 1.010–1.025)
Urobilinogen, UA: 0.2 E.U./dL
pH, UA: 6 (ref 5.0–8.0)

## 2022-03-26 NOTE — Progress Notes (Signed)
ROB: Notes issues with headaches. Feels more tension in back of her neck. Discussed likely tension headaches. Denies any major stressors. Is taking Excedrin. Interested in [place] (or at least immersion therapy with land birth). Flu vaccine given. AFP today. Normal MaterniT21 done. RTC in 4 weeks, for anatomy scan.

## 2022-03-26 NOTE — Progress Notes (Signed)
ROB. Patient states frequent headaches, husband has taken BP during a episode and it was normal for her. Pt ok to have AFP drawn today.

## 2022-03-31 LAB — AFP, SERUM, OPEN SPINA BIFIDA
AFP MoM: 1.73
AFP Value: 55.2 ng/mL
Gest. Age on Collection Date: 16.3 weeks
Maternal Age At EDD: 39.1 yr
OSBR Risk 1 IN: 1514
Test Results:: NEGATIVE
Weight: 174 [lb_av]

## 2022-04-27 ENCOUNTER — Other Ambulatory Visit: Payer: BC Managed Care – PPO

## 2022-04-27 ENCOUNTER — Encounter: Payer: BC Managed Care – PPO | Admitting: Certified Nurse Midwife

## 2022-04-27 DIAGNOSIS — Z3480 Encounter for supervision of other normal pregnancy, unspecified trimester: Secondary | ICD-10-CM

## 2022-05-01 ENCOUNTER — Ambulatory Visit
Admission: RE | Admit: 2022-05-01 | Discharge: 2022-05-01 | Disposition: A | Discharge status: Home / Self Care | Payer: BC Managed Care – PPO | Source: Ambulatory Visit | Attending: Obstetrics and Gynecology | Admitting: Obstetrics and Gynecology

## 2022-05-01 DIAGNOSIS — Z3A20 20 weeks gestation of pregnancy: Secondary | ICD-10-CM | POA: Diagnosis not present

## 2022-05-01 DIAGNOSIS — Z3689 Encounter for other specified antenatal screening: Secondary | ICD-10-CM | POA: Insufficient documentation

## 2022-05-04 ENCOUNTER — Other Ambulatory Visit: Payer: Self-pay | Admitting: Obstetrics and Gynecology

## 2022-05-04 DIAGNOSIS — Z362 Encounter for other antenatal screening follow-up: Secondary | ICD-10-CM

## 2022-05-14 ENCOUNTER — Ambulatory Visit (INDEPENDENT_AMBULATORY_CARE_PROVIDER_SITE_OTHER): Payer: BC Managed Care – PPO

## 2022-05-14 DIAGNOSIS — Z3A22 22 weeks gestation of pregnancy: Secondary | ICD-10-CM | POA: Diagnosis not present

## 2022-05-14 DIAGNOSIS — Z362 Encounter for other antenatal screening follow-up: Secondary | ICD-10-CM

## 2022-05-15 ENCOUNTER — Encounter: Payer: Self-pay | Admitting: Licensed Practical Nurse

## 2022-05-15 ENCOUNTER — Ambulatory Visit (INDEPENDENT_AMBULATORY_CARE_PROVIDER_SITE_OTHER): Payer: BC Managed Care – PPO | Admitting: Licensed Practical Nurse

## 2022-05-15 VITALS — BP 111/73 | HR 78 | Wt 184.9 lb

## 2022-05-15 DIAGNOSIS — Z362 Encounter for other antenatal screening follow-up: Secondary | ICD-10-CM

## 2022-05-15 DIAGNOSIS — Z131 Encounter for screening for diabetes mellitus: Secondary | ICD-10-CM

## 2022-05-15 DIAGNOSIS — Z3A22 22 weeks gestation of pregnancy: Secondary | ICD-10-CM

## 2022-05-15 DIAGNOSIS — Z3481 Encounter for supervision of other normal pregnancy, first trimester: Secondary | ICD-10-CM

## 2022-05-15 LAB — POCT URINALYSIS DIPSTICK
Bilirubin, UA: NEGATIVE
Blood, UA: NEGATIVE
Glucose, UA: NEGATIVE
Ketones, UA: NEGATIVE
Leukocytes, UA: NEGATIVE
Nitrite, UA: NEGATIVE
Protein, UA: NEGATIVE
Spec Grav, UA: 1.015 (ref 1.010–1.025)
Urobilinogen, UA: 0.2 E.U./dL
pH, UA: 7 (ref 5.0–8.0)

## 2022-05-15 NOTE — Progress Notes (Unsigned)
Routine Prenatal Care Visit  Subjective  Kelsey Campbell is a 36 y.o. G4P2012 at [redacted]w[redacted]d being seen today for ongoing prenatal care.  She is currently monitored for the following issues for this {Blank single:19197::"high-risk","low-risk"} pregnancy and has History of reversal of tubal ligation; History of ectopic pregnancy; Abdominal bloating; Adverse food reaction; Seasonal allergic rhinitis due to pollen; and Supervision of other normal pregnancy, antepartum on their problem list.  ----------------------------------------------------------------------------------- Patient reports {sx:14538}.   Contractions: Not present. Vag. Bleeding: None.  Movement: Present. Leaking Fluid {Actions; denies/reports/admits to:19208}.  ----------------------------------------------------------------------------------- The following portions of the patient's history were reviewed and updated as appropriate: allergies, current medications, past family history, past medical history, past social history, past surgical history and problem list. Problem list updated.  Objective  Blood pressure 111/73, pulse 78, weight 184 lb 14.4 oz (83.9 kg), last menstrual period 12/02/2021, unknown if currently breastfeeding. Pregravid weight 163 lb (73.9 kg) Total Weight Gain 21 lb 14.4 oz (9.934 kg) Urinalysis: Urine Protein    Urine Glucose    Fetal Status: Fetal Heart Rate (bpm): 140   Movement: Present     General:  Alert, oriented and cooperative. Patient is in no acute distress.  Skin: Skin is warm and dry. No rash noted.   Cardiovascular: Normal heart rate noted  Respiratory: Normal respiratory effort, no problems with respiration noted  Abdomen: Soft, gravid, appropriate for gestational age. Pain/Pressure: Absent     Pelvic:  {Blank single:19197::"Cervical exam performed","Cervical exam deferred"}        Extremities: Normal range of motion.  Edema: Mild pitting, slight indentation  Mental Status: Normal mood and affect.  Normal behavior. Normal judgment and thought content.   Assessment   36 y.o. [id number] at [redacted]w[redacted]d by  09/14/2022, by Ultrasound presenting for {Blank single:19197::"routine","work-in"} prenatal visit  Plan   G4 Problems (from 01/16/22 to present)     Problem Noted Resolved   Supervision of other normal pregnancy, antepartum 01/27/2022 by Loran Senters, CMA No   Overview Signed 03/26/2022  3:38 PM by Loney Laurence, CMA     Clinical Staff Provider  Office Location  [facility] Dating  09/08/2022, Date entered prior to episode creation  Language  english Anatomy US    Flu Vaccine   Genetic Screen  NIPS:   TDaP vaccine    Hgb A1C or  GTT Early : Third trimester :   Covid    LAB RESULTS   Rhogam  A/Positive/-- (09/18 1528)  Blood Type A/Positive/-- (09/18 1528)   Feeding Plan  Antibody Negative (09/18 1528)  Contraception  Rubella 2.09 (09/18 1528)  Circumcision  RPR Non Reactive (09/18 1528)   Pediatrician   HBsAg Negative (09/18 1528)   Support Person  HIV Non Reactive (09/18 1528)  Prenatal Classes  Varicella     GBS  (For PCN allergy, check sensitivities)   BTL Consent  Hep C Non Reactive (09/18 1528)   VBAC Consent  Pap Diagnosis  Date Value Ref Range Status  02/25/2022   Final   - Negative for intraepithelial lesion or malignancy (NILM)      Hgb Electro      CF      SMA                  {Blank single:19197::"Term","Preterm"} labor symptoms and general obstetric precautions including but not limited to vaginal bleeding, contractions, leaking of fluid and fetal movement were reviewed in detail with the patient. Please refer to After Visit Summary for other counseling  recommendations.   Return in about 4 weeks (around 06/12/2022) for ROB.  @MYSIGNATURE @

## 2022-06-01 NOTE — L&D Delivery Note (Signed)
OB/GYN Faculty Practice Delivery Note  Kelsey Campbell is a 37 y.o. [id number] s/p SVD at [redacted]w[redacted]d. She was admitted for post-dates IOL.   ROM: 10h 70m with meconium stained fluid (was thin, became thick at delivery) GBS Status: neg Maximum Maternal Temperature: 98.6  Labor Progress: IOL started with cytotec + FB, then Pitocin and AROM. After AROM, pt very uncomfortable with urge to poop so rechecked and was still 4.5cm. Allowed to get in the water for rest and she progressed rapidly to 9.5cm with involuntary pushing. Attempted not to push but was unable and cervix became edematous so she got out for an epidural, benadryl and pitocin. Progressed to complete and +2.  Delivery Date/Time: 09/19/22 at 0738 Delivery: Head delivered direct OP with a nuchal cord. Shoulder and body delivered in usual fashion. Infant with spontaneous cry, placed on mother's abdomen, dried and stimulated. Cord clamped x 2 after 3-minute delay, and cut by FOB. Cord blood drawn and cord sample collected. Placenta delivered spontaneously, intact, with 3-vessel cord. Fundus firm with massage and Pitocin. Labia, perineum, vagina, and cervix inspected, 2nd degree perineal laceration found and repaired with 3.0 vicryl.   Placenta: spontaneous, intact, pt keeping for encapsulation - sent home in cooler with doula Complications: None Lacerations: 2nd degree EBL: Analgesia: epidural  Postpartum Planning  transfer orders to MB  discharge summary started & shared  message to sent to schedule follow-up   lists updated  vaccines UTD  Infant: Boy (yes)  APGARs 8/9  weight pending  Edd Arbour, CNM, IBCLC Certified Nurse Midwife, [employer] for [employer], [facility] 09/19/2022, 8:18 AM

## 2022-06-03 ENCOUNTER — Telehealth: Payer: Self-pay

## 2022-06-03 NOTE — Telephone Encounter (Signed)
Left pelvic groin area pain started last night. No pain with urination, She states she had drank plenty of water and that helped with the cramps. But still has this pain in the groin area. I advised this is probably nothing to worry about, you have pains while pregnant. Please advise on any advise I could give.

## 2022-06-04 NOTE — Telephone Encounter (Signed)
Pt aware.

## 2022-06-12 ENCOUNTER — Encounter: Payer: Self-pay | Admitting: Obstetrics and Gynecology

## 2022-06-15 ENCOUNTER — Encounter: Payer: BC Managed Care – PPO | Admitting: Certified Nurse Midwife

## 2022-06-15 ENCOUNTER — Ambulatory Visit (INDEPENDENT_AMBULATORY_CARE_PROVIDER_SITE_OTHER): Payer: BC Managed Care – PPO | Admitting: Certified Nurse Midwife

## 2022-06-15 ENCOUNTER — Encounter: Payer: Self-pay | Admitting: Certified Nurse Midwife

## 2022-06-15 VITALS — BP 112/68 | HR 89

## 2022-06-15 DIAGNOSIS — Z3480 Encounter for supervision of other normal pregnancy, unspecified trimester: Secondary | ICD-10-CM

## 2022-06-15 DIAGNOSIS — Z3482 Encounter for supervision of other normal pregnancy, second trimester: Secondary | ICD-10-CM

## 2022-06-15 DIAGNOSIS — Z3A27 27 weeks gestation of pregnancy: Secondary | ICD-10-CM

## 2022-06-15 DIAGNOSIS — Z23 Encounter for immunization: Secondary | ICD-10-CM | POA: Diagnosis not present

## 2022-06-15 NOTE — Patient Instructions (Signed)
Oral Glucose Tolerance Test During Pregnancy Why am I having this test? The oral glucose tolerance test (OGTT) is done to check how your body processes blood sugar (glucose). This is one of several tests used to diagnose diabetes that develops during pregnancy (gestational diabetes mellitus). Gestational diabetes is a short-term form of diabetes that some women develop while they are pregnant. It usually occurs during the second trimester of pregnancy and goes away after delivery. Testing, or screening, for gestational diabetes usually occurs at weeks 24-28 of pregnancy. You may have the OGTT test after having a 1-hour glucose screening test if the results from that test indicate that you may have gestational diabetes. This test may also be needed if: You have a history of gestational diabetes. There is a history of giving birth to very large babies or of losing pregnancies (having stillbirths). You have signs and symptoms of diabetes, such as: Changes in your eyesight. Tingling or numbness in your hands or feet. Changes in hunger, thirst, and urination, and these are not explained by your pregnancy. What is being tested? This test measures the amount of glucose in your blood at different times during a period of 3 hours. This shows how well your body can process glucose. What kind of sample is taken?  Blood samples are required for this test. They are usually collected by inserting a needle into a blood vessel. How do I prepare for this test? For 3 days before your test, eat normally. Have plenty of carbohydrate-rich foods. Follow instructions from your health care provider about: Eating or drinking restrictions on the day of the test. You may be asked not to eat or drink anything other than water (to fast) starting 8-10 hours before the test. Changing or stopping your regular medicines. Some medicines may interfere with this test. Tell a health care provider about: All medicines you are  taking, including vitamins, herbs, eye drops, creams, and over-the-counter medicines. Any blood disorders you have. Any surgeries you have had. Any medical conditions you have. What happens during the test? First, your blood glucose will be measured. This is referred to as your fasting blood glucose because you fasted before the test. Then, you will drink a glucose solution that contains a certain amount of glucose. Your blood glucose will be measured again 1, 2, and 3 hours after you drink the solution. This test takes about 3 hours to complete. You will need to stay at the testing location during this time. During the testing period: Do not eat or drink anything other than the glucose solution. Do not exercise. Do not use any products that contain nicotine or tobacco, such as cigarettes, e-cigarettes, and chewing tobacco. These can affect your test results. If you need help quitting, ask your health care provider. The testing procedure may vary among health care providers and hospitals. How are the results reported? Your results will be reported as milligrams of glucose per deciliter of blood (mg/dL) or millimoles per liter (mmol/L). There is more than one source for screening and diagnosis reference values used to diagnose gestational diabetes. Your health care provider will compare your results to normal values that were established after testing a large group of people (reference values). Reference values may vary among labs and hospitals. For this test (Carpenter-Coustan), reference values are: Fasting: 95 mg/dL (5.3 mmol/L). 1 hour: 180 mg/dL (10.0 mmol/L). 2 hour: 155 mg/dL (8.6 mmol/L). 3 hour: 140 mg/dL (7.8 mmol/L). What do the results mean? Results below the reference values are   considered normal. If two or more of your blood glucose levels are at or above the reference values, you may be diagnosed with gestational diabetes. If only one level is high, your health care provider may  suggest repeat testing or other tests to confirm a diagnosis. Talk with your health care provider about what your results mean. Questions to ask your health care provider Ask your health care provider, or the department that is doing the test: When will my results be ready? How will I get my results? What are my treatment options? What other tests do I need? What are my next steps? Summary The oral glucose tolerance test (OGTT) is one of several tests used to diagnose diabetes that develops during pregnancy (gestational diabetes mellitus). Gestational diabetes is a short-term form of diabetes that some women develop while they are pregnant. You may have the OGTT test after having a 1-hour glucose screening test if the results from that test show that you may have gestational diabetes. You may also have this test if you have any symptoms or risk factors for this type of diabetes. Talk with your health care provider about what your results mean. This information is not intended to replace advice given to you by your health care provider. Make sure you discuss any questions you have with your health care provider. Document Revised: 12/23/2021 Document Reviewed: 10/26/2019 Elsevier Patient Education  2023 Elsevier Inc.  

## 2022-06-15 NOTE — Progress Notes (Signed)
ROB doing well. PT state she recently had a rash that was itching. It started on her abdomen and under her breast , then was noted on her hips, forearm, and leg. She state she used benadryl times one and is using Aquaphor lotion. It is now improving . She notes that she recently switched vitamin B supplement to a different brand. Discussed common skin conditions in pregnancy. Given that it is going away likely dermatitis or allergy reaction . Reassurance given. PT state she is interested in water birth. Encouraged there to sign up/take water birth class. She will follow up with certificate once completed. Discussed glucose screen next visit. She verbalizes and agrees. Follow up 1 wk for ROB and glucose .   Philip Aspen, CNM

## 2022-06-16 ENCOUNTER — Encounter: Payer: Self-pay | Admitting: Certified Nurse Midwife

## 2022-06-24 ENCOUNTER — Ambulatory Visit (INDEPENDENT_AMBULATORY_CARE_PROVIDER_SITE_OTHER): Payer: BC Managed Care – PPO | Admitting: Certified Nurse Midwife

## 2022-06-24 VITALS — BP 101/64 | HR 72 | Ht 64.0 in | Wt 190.6 lb

## 2022-06-24 DIAGNOSIS — O0933 Supervision of pregnancy with insufficient antenatal care, third trimester: Secondary | ICD-10-CM

## 2022-06-24 DIAGNOSIS — Z3A28 28 weeks gestation of pregnancy: Secondary | ICD-10-CM | POA: Diagnosis not present

## 2022-06-24 DIAGNOSIS — Z3493 Encounter for supervision of normal pregnancy, unspecified, third trimester: Secondary | ICD-10-CM

## 2022-06-24 NOTE — Progress Notes (Signed)
History:   Kelsey Campbell is a 37 y.o. [phone] at [redacted]w[redacted]d by early ultrasound being seen today for her first obstetrical visit.  Her obstetrical history is significant for advanced maternal age. Patient does intend to breast feed. Pregnancy history fully reviewed.  Patient reports no complaints. Is switching to [place] for better waterbirth availability - is being seen at [facility] and was told they do not have new tubs installed, she would need to purchase/set up a tub and coverage is less because there is only one credentialed midwife.      HISTORY: OB History  Gravida Para Term Preterm AB Living  4 2 2  0 1 2  SAB IAB Ectopic Multiple Live Births  0 0 1 0 2    # Outcome Date GA Lbr Len/2nd Weight Sex Delivery Anes PTL Lv  4 Current           3 Ectopic 2020     ECTOPIC     2 Term 04/05/06 [redacted]w[redacted]d  7 lb 1 oz (3.204 kg) M   N LIV  1 Term 08/23/02 [redacted]w[redacted]d  7 lb 3 oz (3.26 kg) M Vag-Spont  N LIV    Last pap smear was done 02/25/22 and was normal  No past medical history on file. Past Surgical History:  Procedure Laterality Date   LAPAROSCOPIC OVARIAN CYSTECTOMY N/A 05/03/2019   Procedure: LAPAROSCOPIC SALPINGECTOMY;  Surgeon: Schermerhorn, Gwen Her, MD;  Location: [facility] ORS;  Service: Gynecology;  Laterality: N/A;   TUBAL LIGATION     tubal ligation reversal     WISDOM TOOTH EXTRACTION     one; early 88s   Family History  Problem Relation Age of Onset   Diabetes Mother        pre diabetic   Fibromyalgia Mother    Eczema Maternal Grandmother    Cancer Maternal Grandmother 49       stage 4 - unsure of what kind   Heart attack Maternal Grandmother    Cancer Maternal Grandfather 46       prostate; throat - 68   Diabetes Maternal Grandfather    Cancer Paternal Grandfather        passed away in his 1s; kind unknown by pt   Asthma Half-Sister        maternal half sister   Social History   Tobacco Use   Smoking status: Former   Smokeless tobacco: Never  Scientific laboratory technician Use:  Never used  Substance Use Topics   Alcohol use: Not Currently    Comment: occasionally   Drug use: Not Currently   Allergies  Allergen Reactions   Sulfamethoxazole-Trimethoprim Itching and Rash   Current Outpatient Medications on File Prior to Visit  Medication Sig Dispense Refill   aspirin EC 81 MG tablet Take 81 mg by mouth daily. Swallow whole.     polyethylene glycol powder (GLYCOLAX/MIRALAX) 17 GM/SCOOP powder Take by mouth as needed.     Prenatal Vit-Fe Fumarate-FA (MULTIVITAMIN-PRENATAL) 27-0.8 MG TABS tablet Take 1 tablet by mouth daily at 12 noon.     No current facility-administered medications on file prior to visit.   Review of Systems Pertinent items noted in HPI and remainder of comprehensive ROS otherwise negative.  Physical Exam:   Vitals:   06/24/22 1052  BP: 101/64  Pulse: 72  Weight: 190 lb 9.6 oz (86.5 kg)   Fetal Heart Rate (bpm): 140   FH: 28cm  Constitutional: Well-developed, well-nourished pregnant female in no acute distress.  HEENT: PERRLA Skin: normal color and turgor, no rash Cardiovascular: normal rate & rhythm Respiratory: normal effort GI: Abd soft, non-tender, gravid appropriate for gestational age, FH appropriate MS: Extremities nontender, no edema, normal ROM Neurologic: Alert and oriented x 4.  Pelvic: exam deferred  Assessment:    Pregnancy: [id number] Patient Active Problem List   Diagnosis Date Noted   Supervision of other normal pregnancy, antepartum 01/27/2022   Abdominal bloating 01/24/2021   Adverse food reaction 01/24/2021   Seasonal allergic rhinitis due to pollen 01/24/2021   History of reversal of tubal ligation    History of ectopic pregnancy      Plan:   1. Supervision of low risk pregnancy, third trimester - Doing well, feeling regular and vigorous fetal movement   2. [redacted] weeks gestation of pregnancy - Routine OB care  - Pt is interested in waterbirth.  No contraindications at this time per chart review/patient  assessment.   - Pt to enroll in class, see CNMs for most visits in the office.  - Discussed waterbirth as option for low-risk pregnancy.  Reviewed conditions that may arise during pregnancy that will risk pt out of waterbirth including hypertension, diabetes, fetal growth restriction <10%ile, etc.  3. Initial obstetric visit, third trimester - Transferring care from [facility] in [place] for waterbirth - Pregnancy history and labs reviewed, needs 3rd trimester labs/testing - will schedule for tomorrow - Continue prenatal vitamins. - Problem list reviewed and updated. - Genetic Screening discussed, First trimester screen, Quad screen, and NIPS: results reviewed. - Ultrasound discussed; fetal anatomic survey: results reviewed. - Anticipatory guidance about prenatal visits given including labs, ultrasounds, and testing. - Discussed usage of Babyscripts and virtual visits as additional source of managing and completing prenatal visits in midst of coronavirus and pandemic.   - Encouraged to complete MyChart Registration for her ability to review results, send requests, and have questions addressed.  - The nature of [place] for [facility]/Faculty Practice with multiple MDs and Advanced Practice Providers was explained to patient; also emphasized that residents, students are part of our team. - Routine obstetric precautions reviewed. Encouraged to seek out care at office or emergency room [facility] preferred) for urgent and/or emergent concerns.  Return in about 2 weeks (around 07/08/2022) for IN-PERSON, LOB.    Gaylan Gerold, MSN, CNM, [place] Certified Nurse Midwife, [facility]

## 2022-06-25 ENCOUNTER — Encounter: Payer: BC Managed Care – PPO | Admitting: Obstetrics and Gynecology

## 2022-06-25 ENCOUNTER — Other Ambulatory Visit: Payer: Self-pay

## 2022-06-25 ENCOUNTER — Other Ambulatory Visit: Payer: BC Managed Care – PPO

## 2022-06-25 DIAGNOSIS — Z3493 Encounter for supervision of normal pregnancy, unspecified, third trimester: Secondary | ICD-10-CM

## 2022-06-26 LAB — CBC
Hematocrit: 30.3 % — ABNORMAL LOW (ref 34.0–46.6)
Hemoglobin: 10.3 g/dL — ABNORMAL LOW (ref 11.1–15.9)
MCH: 32.7 pg (ref 26.6–33.0)
MCHC: 34 g/dL (ref 31.5–35.7)
MCV: 96 fL (ref 79–97)
Platelets: 222 10*3/uL (ref 150–450)
RBC: 3.15 x10E6/uL — ABNORMAL LOW (ref 3.77–5.28)
RDW: 12.4 % (ref 11.7–15.4)
WBC: 8 10*3/uL (ref 3.4–10.8)

## 2022-06-26 LAB — GLUCOSE TOLERANCE, 2 HOURS W/ 1HR
Glucose, 1 hour: 165 mg/dL (ref 70–179)
Glucose, 2 hour: 139 mg/dL (ref 70–152)
Glucose, Fasting: 88 mg/dL (ref 70–91)

## 2022-06-26 LAB — RPR: RPR Ser Ql: NONREACTIVE

## 2022-06-26 LAB — HIV ANTIBODY (ROUTINE TESTING W REFLEX): HIV Screen 4th Generation wRfx: NONREACTIVE

## 2022-07-02 IMAGING — RF DG HYSTEROGRAM
7 series · 9 of 9 positions shown · non-contrast
Comparison: None.

CLINICAL DATA: History of right salpingectomy for ectopic
pregnancy. Fertility workup.

EXAM:
HYSTEROSALPINGOGRAM
TECHNIQUE: Hysterosalpingogram was performed by the ordering physician under
fluoroscopy. Fluoroscopic images were submitted for radiologic
interpretation following the procedure. Please see the procedural
report for the amount of contrast and the fluoroscopy time utilized.

[Series 1: fluoro_hsg_2fps_bw · 0.17mm/px · 1 of 1 slices shown (1 of 7)]
[im 1/1]
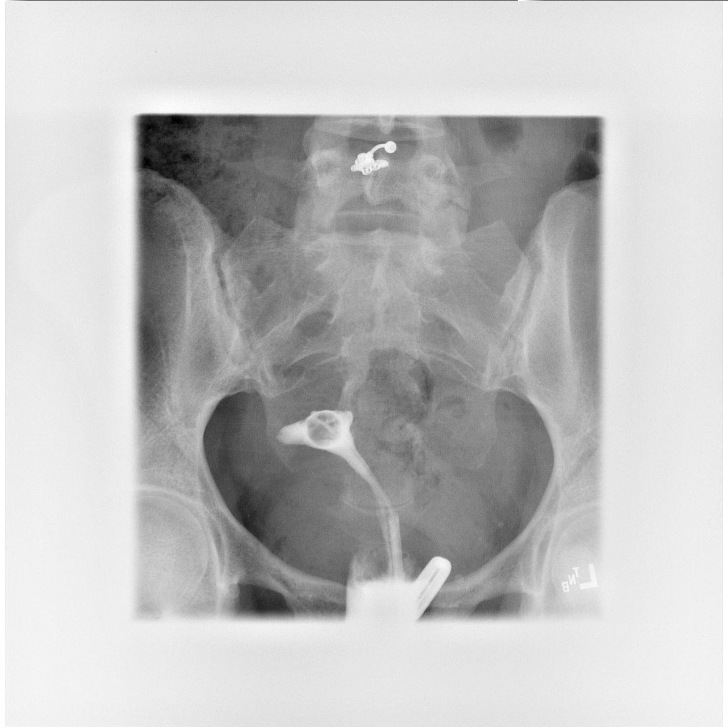

[Series 2: fluoro_hsg_2fps_bw · 0.17mm/px · 1 of 1 slices shown (2 of 7)]
[im 1/1]
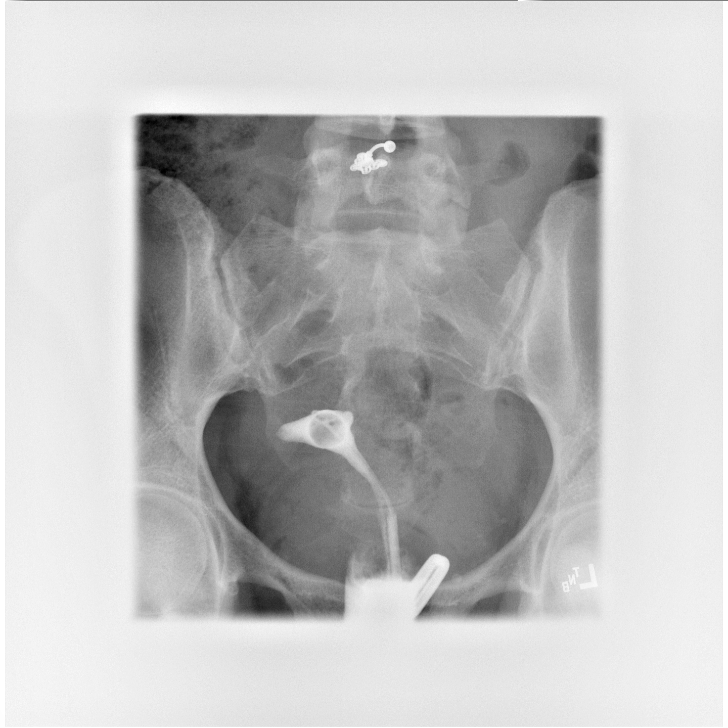

[Series 3: fluoro_hsg_2fps_bw · 0.17mm/px · 1 of 1 slices shown (3 of 7)]
[im 1/1]
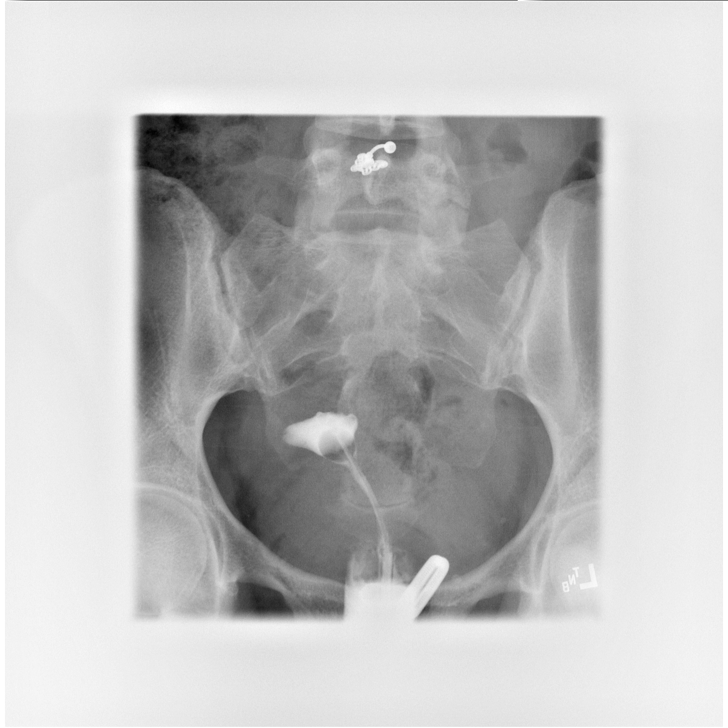

[Series 4: fluoro_hsg_2fps_bw · 0.17mm/px · 1 of 1 slices shown (4 of 7)]
[im 1/1]
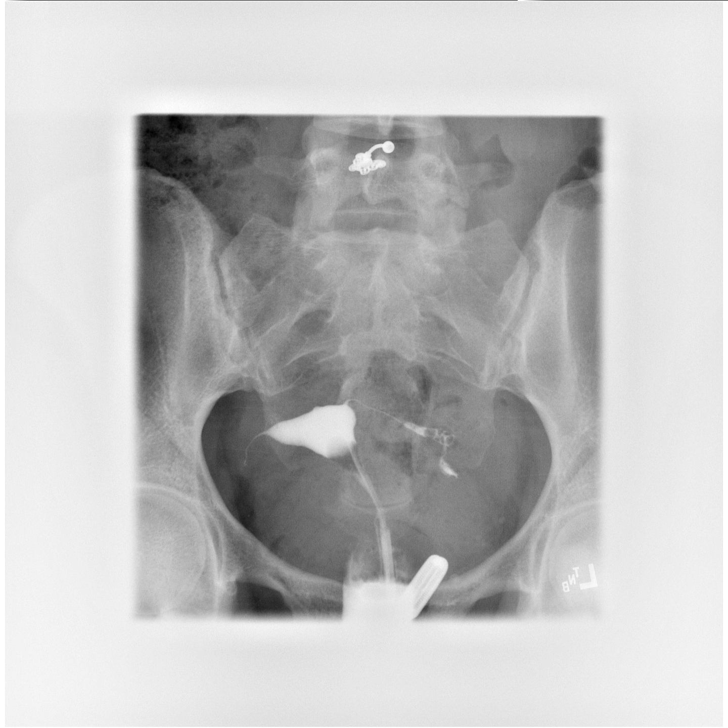

[Series 5: fluoro_hsg_2fps_bw · 0.17mm/px · 1 of 1 slices shown (5 of 7)]
[im 1/1]
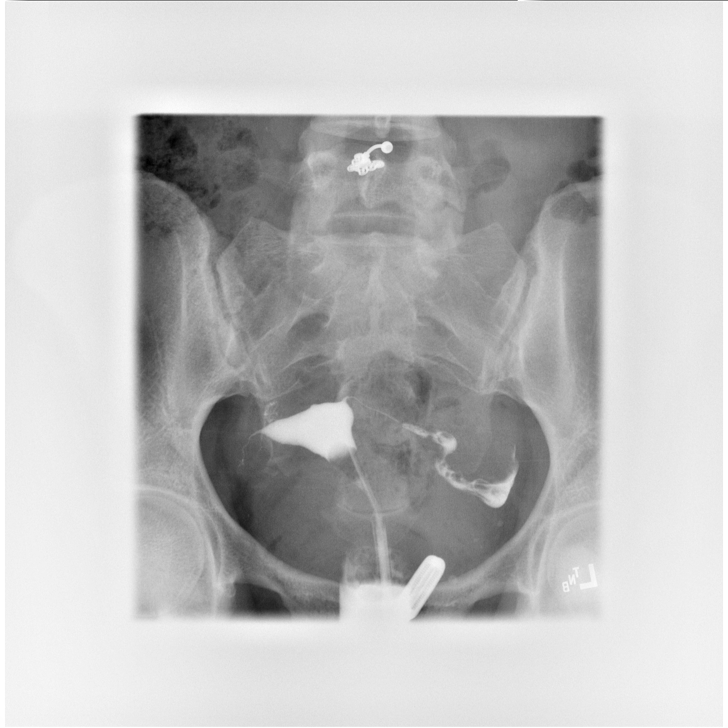

[Series 6: fluoro_hsg_2fps_bw · 0.17mm/px · 2 of 2 frames shown (6 of 7)]
[frame 1/2]
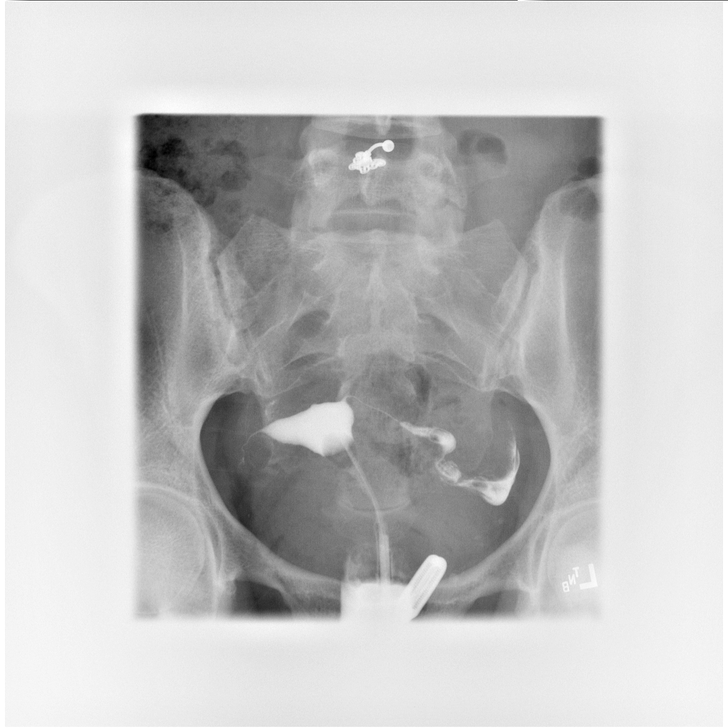
[frame 2/2]
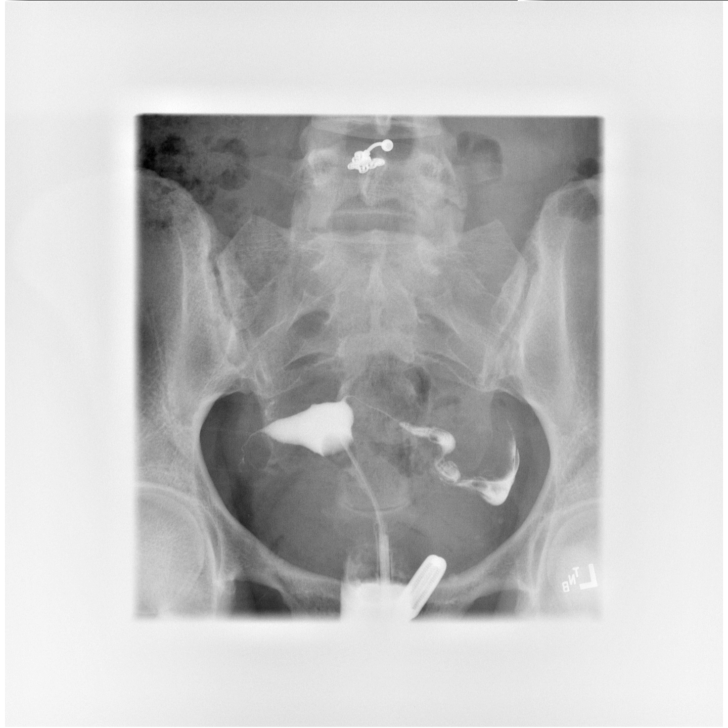

[Series 7: fluoro_hsg_2fps_bw · 0.17mm/px · 2 of 2 frames shown (7 of 7)]
[frame 1/2]
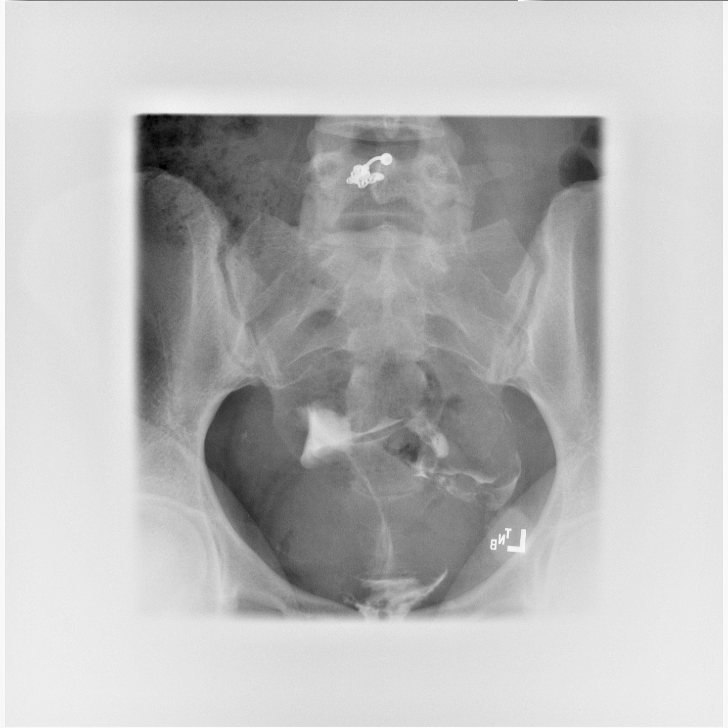
[frame 2/2]
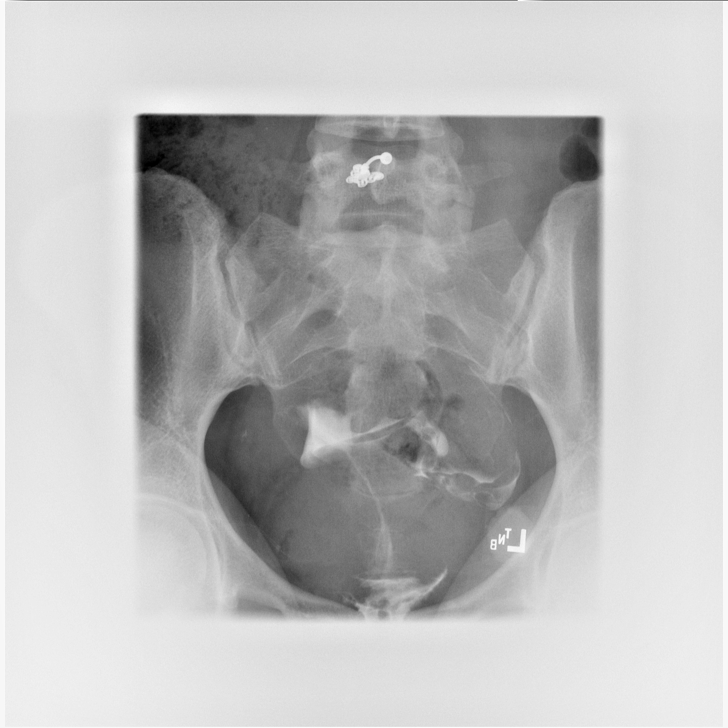

[9 of 9 positions shown; findings below may reference images not displayed]

FINDINGS: Multiple images obtained during HSG show unremarkable appearance of
the endometrial cavity. Later images demonstrate fill of a normal
appearing left fallopian tube with free spill of contrast from the
fimbriated end into the adjacent peritoneal cavity.

There was some filling of a nondilated right fallopian tube adjacent
to the uterine cornua, but this did not extend to the fimbriated
portion of the tube compatible with reported clinical history of
salpingectomy.
IMPRESSION: 1. Patent left fallopian tube.
2. Partial opacification of the right fallopian tube compatible with
the reported history of right salpingectomy.
3. Unremarkable appearance of the endometrial cavity.

## 2022-07-15 ENCOUNTER — Ambulatory Visit (INDEPENDENT_AMBULATORY_CARE_PROVIDER_SITE_OTHER): Payer: BC Managed Care – PPO | Admitting: Certified Nurse Midwife

## 2022-07-15 ENCOUNTER — Other Ambulatory Visit: Payer: Self-pay

## 2022-07-15 VITALS — BP 110/65 | HR 75 | Wt 194.8 lb

## 2022-07-15 DIAGNOSIS — Z3A31 31 weeks gestation of pregnancy: Secondary | ICD-10-CM

## 2022-07-15 DIAGNOSIS — Z3493 Encounter for supervision of normal pregnancy, unspecified, third trimester: Secondary | ICD-10-CM

## 2022-07-19 NOTE — Progress Notes (Signed)
   PRENATAL VISIT NOTE  Subjective:  Kelsey Campbell is a 37 y.o. [id number] at 70w6dbeing seen today for ongoing prenatal care.  She is currently monitored for the following issues for this low-risk pregnancy and has History of reversal of tubal ligation; History of ectopic pregnancy; and Supervision of other normal pregnancy, antepartum on their problem list.  Patient reports no complaints.  Contractions: Not present. Vag. Bleeding: None.  Movement: Present. Denies leaking of fluid.   The following portions of the patient's history were reviewed and updated as appropriate: allergies, current medications, past family history, past medical history, past social history, past surgical history and problem list.   Objective:   Vitals:   07/15/22 1525  BP: 110/65  Pulse: 75  Weight: 194 lb 12.8 oz (88.4 kg)    Fetal Status: Fetal Heart Rate (bpm): 136 Fundal Height: 31 cm Movement: Present     General:  Alert, oriented and cooperative. Patient is in no acute distress.  Skin: Skin is warm and dry. No rash noted.   Cardiovascular: Normal heart rate noted  Respiratory: Normal respiratory effort, no problems with respiration noted  Abdomen: Soft, gravid, appropriate for gestational age.  Pain/Pressure: Present     Pelvic: Cervical exam deferred        Extremities: Normal range of motion.  Edema: None  Mental Status: Normal mood and affect. Normal behavior. Normal judgment and thought content.   Assessment and Plan:  Pregnancy: [id number] 363w6d. Encounter for supervision of low-risk pregnancy in third trimester - Doing well, feeling regular and vigorous fetal movement   2. [redacted] weeks gestation of pregnancy - Routine OB care   Preterm labor symptoms and general obstetric precautions including but not limited to vaginal bleeding, contractions, leaking of fluid and fetal movement were reviewed in detail with the patient. Please refer to After Visit Summary for other counseling recommendations.    Return in about 2 weeks (around 07/29/2022) for IN-PERSON, LOB.  Future Appointments  Date Time Provider [place]2/28/2024  3:15 PM WaHelaine ChessMGen[facility]3/20/2024  4:15 PM WaHelaine ChessM[facility]/27/2024  4:15 PM Anyanwu, UgSallyanne HaversMD [facility]ur[facility]  JaGabriel CarinaCNM

## 2022-07-27 ENCOUNTER — Encounter: Payer: Self-pay | Admitting: Certified Nurse Midwife

## 2022-07-29 ENCOUNTER — Other Ambulatory Visit: Payer: Self-pay

## 2022-07-29 ENCOUNTER — Ambulatory Visit (INDEPENDENT_AMBULATORY_CARE_PROVIDER_SITE_OTHER): Payer: BC Managed Care – PPO | Admitting: Certified Nurse Midwife

## 2022-07-29 VITALS — BP 119/61 | HR 87 | Wt 197.0 lb

## 2022-07-29 DIAGNOSIS — Z3A33 33 weeks gestation of pregnancy: Secondary | ICD-10-CM

## 2022-07-29 DIAGNOSIS — Z3493 Encounter for supervision of normal pregnancy, unspecified, third trimester: Secondary | ICD-10-CM

## 2022-07-29 DIAGNOSIS — R12 Heartburn: Secondary | ICD-10-CM

## 2022-07-29 DIAGNOSIS — O26893 Other specified pregnancy related conditions, third trimester: Secondary | ICD-10-CM

## 2022-07-29 MED ORDER — OMEPRAZOLE 40 MG PO CPDR: 40.0000 mg | DELAYED_RELEASE_CAPSULE | Freq: Every day | ORAL | 2 refills | Status: DC

## 2022-07-29 NOTE — Progress Notes (Signed)
   PRENATAL VISIT NOTE  Subjective:  Kelsey Campbell is a 37 y.o. [id number] at 28w2dbeing seen today for ongoing prenatal care.  She is currently monitored for the following issues for this low-risk pregnancy and has History of reversal of tubal ligation; History of ectopic pregnancy; and Supervision of other normal pregnancy, antepartum on their problem list.  Patient reports heartburn.  Contractions: Not present. Vag. Bleeding: None.  Movement: Present. Denies leaking of fluid.   The following portions of the patient's history were reviewed and updated as appropriate: allergies, current medications, past family history, past medical history, past social history, past surgical history and problem list.   Objective:   Vitals:   07/29/22 1516  BP: 119/61  Pulse: 87  Weight: 197 lb (89.4 kg)    Fetal Status: Fetal Heart Rate (bpm): 132 Fundal Height: 34 cm Movement: Present     General:  Alert, oriented and cooperative. Patient is in no acute distress.  Skin: Skin is warm and dry. No rash noted.   Cardiovascular: Normal heart rate noted  Respiratory: Normal respiratory effort, no problems with respiration noted  Abdomen: Soft, gravid, appropriate for gestational age.  Pain/Pressure: Present     Pelvic: Cervical exam deferred        Extremities: Normal range of motion.  Edema: Trace  Mental Status: Normal mood and affect. Normal behavior. Normal judgment and thought content.   Assessment and Plan:  Pregnancy: [id number] 364w2d. Encounter for supervision of low-risk pregnancy in third trimester - Doing well, feeling regular and vigorous fetal movement   2. [redacted] weeks gestation of pregnancy - Routine OB care including anticipatory guidance re GBS testing at next visit  3. Heartburn during pregnancy in third trimester - omeprazole (PRILOSEC) 40 MG capsule; Take 1 capsule (40 mg total) by mouth daily.  Dispense: 30 capsule; Refill: 2  Preterm labor symptoms and general obstetric  precautions including but not limited to vaginal bleeding, contractions, leaking of fluid and fetal movement were reviewed in detail with the patient. Please refer to After Visit Summary for other counseling recommendations.   Return in about 3 weeks (around 08/19/2022) for IN-PERSON, LOB/GTT.  Future Appointments  Date Time Provider [place]3/20/2024  4:15 PM WaHelaine ChessMGrossnickle [facility] IncM[facility]3/27/2024  4:15 PM Anyanwu, UgSallyanne HaversMD [facility]St. [facility]  JaGabriel CarinaCNM

## 2022-07-30 ENCOUNTER — Encounter: Payer: Self-pay | Admitting: Certified Nurse Midwife

## 2022-08-12 ENCOUNTER — Encounter: Payer: Self-pay | Admitting: Certified Nurse Midwife

## 2022-08-19 ENCOUNTER — Other Ambulatory Visit: Payer: Self-pay

## 2022-08-19 ENCOUNTER — Ambulatory Visit (INDEPENDENT_AMBULATORY_CARE_PROVIDER_SITE_OTHER): Payer: BC Managed Care – PPO | Admitting: Certified Nurse Midwife

## 2022-08-19 ENCOUNTER — Other Ambulatory Visit (HOSPITAL_COMMUNITY)
Admission: RE | Admit: 2022-08-19 | Discharge: 2022-08-19 | Disposition: A | Discharge status: Home / Self Care | Payer: BC Managed Care – PPO | Source: Ambulatory Visit | Attending: Certified Nurse Midwife | Admitting: Certified Nurse Midwife

## 2022-08-19 VITALS — BP 111/67 | HR 83 | Wt 198.7 lb

## 2022-08-19 DIAGNOSIS — Z3A36 36 weeks gestation of pregnancy: Secondary | ICD-10-CM | POA: Diagnosis not present

## 2022-08-19 DIAGNOSIS — O26893 Other specified pregnancy related conditions, third trimester: Secondary | ICD-10-CM | POA: Diagnosis not present

## 2022-08-19 DIAGNOSIS — O479 False labor, unspecified: Secondary | ICD-10-CM

## 2022-08-19 DIAGNOSIS — Z3493 Encounter for supervision of normal pregnancy, unspecified, third trimester: Secondary | ICD-10-CM

## 2022-08-19 NOTE — Progress Notes (Signed)
   PRENATAL VISIT NOTE  Subjective:  Kelsey Campbell is a 37 y.o. [id number] at [redacted]w[redacted]d being seen today for ongoing prenatal care.  She is currently monitored for the following issues for this low-risk pregnancy and has History of reversal of tubal ligation; History of ectopic pregnancy; and Supervision of other normal pregnancy, antepartum on their problem list.  Patient reports  some cramping and occasional contractions .  Contractions: Irritability. Vag. Bleeding: None.  Movement: Present. Denies leaking of fluid.   The following portions of the patient's history were reviewed and updated as appropriate: allergies, current medications, past family history, past medical history, past social history, past surgical history and problem list.   Objective:   Vitals:   08/19/22 1641  BP: 111/67  Pulse: 83  Weight: 198 lb 11.2 oz (90.1 kg)   Fetal Status: Fetal Heart Rate (bpm): 134 Fundal Height: 36 cm Movement: Present     General:  Alert, oriented and cooperative. Patient is in no acute distress.  Skin: Skin is warm and dry. No rash noted.   Cardiovascular: Normal heart rate noted  Respiratory: Normal respiratory effort, no problems with respiration noted  Abdomen: Soft, gravid, appropriate for gestational age.  Pain/Pressure: Present     Pelvic: Cervical exam performed in the presence of a chaperone Dilation: Fingertip Effacement (%): Thick Station: Ballotable  Extremities: Normal range of motion.  Edema: Moderate pitting, indentation subsides rapidly  Mental Status: Normal mood and affect. Normal behavior. Normal judgment and thought content.   Assessment and Plan:  Pregnancy: [id number] at [redacted]w[redacted]d 1. Encounter for supervision of low-risk pregnancy in third trimester - Doing well, feeling regular and vigorous fetal movement   2. [redacted] weeks gestation of pregnancy - Routine OB care  - Culture, beta strep (group b only) - Cervicovaginal ancillary only( [place])  3. Uterine contractions  during pregnancy - Performed cervical exam, cervix a fingertip/thick/ballotable. Suspect contractions are from malposition, encouraged Kelsey Campbell to correct fetal positioning  Preterm labor symptoms and general obstetric precautions including but not limited to vaginal bleeding, contractions, leaking of fluid and fetal movement were reviewed in detail with the patient. Please refer to After Visit Summary for other counseling recommendations.   Return in about 1 week (around 08/26/2022) for IN-PERSON, LOB.  Future Appointments  Date Time Provider [place]  08/26/2022  4:15 PM Anyanwu, Sallyanne Havers, MD [facility]    Kelsey Campbell, CNM

## 2022-08-19 NOTE — Patient Instructions (Signed)
The [employer]  This circuit takes at least 90 minutes to complete so clear your schedule and make mental preparations so you can relax in your environment. The second step requires a lot of pillows so gather them up before beginning Before starting, you should empty your bladder! Have a nice drink nearby, and make sure it has a straw! If you are having contractions, this circuit should be done through contractions, try not to change positions between steps Before you begin...  "I named this 'circuit' after my friend Kelsey Campbell, who shared and discussed it with me when I was working with a client whose labor seemed to be stalled out and no longer progressing... This circuit is useful to help get the baby lined up, ideally, in the "Left Occiput Anterior" (LOA) Position, both before labor begins and when some corrections need to be done during labor. Prenatally, this position set can help to rotate a baby. As a natural method of induction, this can help get things going if baby just needed a gentle nudge of position to set things off. To the best of my knowledge, this group of positions will not "hurt" a baby that is already lined up correctly." - Kelsey Campbell   Step One: Open-knee Chest Stay in this position for 30 minutes, start in cat/cow, then drop your chest as low as you can to the bed or the floor and your bottom as high as you can. Knees should be fairly wide apart, and the angle between the torso/thighs should be wider than 90 degrees. Wiggle around, prop with lots of pillows and use this time to get totally relaxed. This position allows the baby to scoot out of the pelvis a bit and gives them room to rotate, shift their head position, etc. If the pregnant person finds it helpful, careful positioning with a rebozo under the belly, with gentle tension from a support person behind can help maintain this position for the full 30 minutes.  Step Two:Exaggerated Left Side  Lying Roll to your left side, bringing your top leg as high as possible and keeping your bottom leg straight. Roll forward as much as possible, again using a lot of pillows. Sink into the bed and relax some more. If you fall asleep, that's totally okay and you can stay there! If not, stay here for at least another half an hour. Try and get your top right leg up towards your head and get as rolled over onto your belly as much as possible. If you repeat the circuit during labor, try alternating left and right sides. We know the photo the left is actually right side... just flip the image in your head.  Step Three: Moving and Lunges Lunge, walk stairs facing sideways, 2 at a time, (have a spotter downstairs of you!), take a walk outside with one foot on the curb and the other on the street, sit on a birth ball and hula- anything that's upright and putting your pelvis in open, asymmetrical positions. Spend at least 30 minutes doing this one as well to give your baby a chance to move down. If you are lunging or stair or curb walking, you should lunge/walk/go up stairs in the direction that feels better to you. The key with the lunge is that the toes of the higher leg and mom's belly button should be at right angles. Do not lunge over your knee, that closes the pelvis.     Kelsey Campbell: Circuit Creator - [email or web address] Kelsey   Campbell, CD, BDT (DONA), LCCE, FACCE: Supporting Content - [email or web address] Kelsey Campbell: Photography - [email or web address] Kelsey Campbell CD/CDT (BAI): Print and Webmaster - [email or web address] [employer] [email or web address]  

## 2022-08-20 DIAGNOSIS — O09523 Supervision of elderly multigravida, third trimester: Secondary | ICD-10-CM | POA: Diagnosis not present

## 2022-08-20 DIAGNOSIS — Z3A36 36 weeks gestation of pregnancy: Secondary | ICD-10-CM | POA: Diagnosis not present

## 2022-08-20 LAB — CERVICOVAGINAL ANCILLARY ONLY
Chlamydia: NEGATIVE
Comment: NEGATIVE
Comment: NEGATIVE
Comment: NORMAL
Neisseria Gonorrhea: NEGATIVE
Trichomonas: NEGATIVE

## 2022-08-24 LAB — CULTURE, BETA STREP (GROUP B ONLY): Strep Gp B Culture: NEGATIVE

## 2022-08-26 ENCOUNTER — Other Ambulatory Visit: Payer: Self-pay

## 2022-08-26 ENCOUNTER — Ambulatory Visit (INDEPENDENT_AMBULATORY_CARE_PROVIDER_SITE_OTHER): Payer: BC Managed Care – PPO | Admitting: Certified Nurse Midwife

## 2022-08-26 VITALS — BP 101/61 | HR 71 | Wt 199.0 lb

## 2022-08-26 DIAGNOSIS — O09529 Supervision of elderly multigravida, unspecified trimester: Secondary | ICD-10-CM | POA: Insufficient documentation

## 2022-08-26 DIAGNOSIS — O09523 Supervision of elderly multigravida, third trimester: Secondary | ICD-10-CM

## 2022-08-26 DIAGNOSIS — Z3493 Encounter for supervision of normal pregnancy, unspecified, third trimester: Secondary | ICD-10-CM

## 2022-08-26 DIAGNOSIS — Z3A37 37 weeks gestation of pregnancy: Secondary | ICD-10-CM

## 2022-08-26 NOTE — Progress Notes (Signed)
   PRENATAL VISIT NOTE  Subjective:  Kelsey Campbell is a 37 y.o. [id number] at [redacted]w[redacted]d being seen today for ongoing prenatal care.  She is currently monitored for the following issues for this low-risk pregnancy and has History of reversal of tubal ligation; History of ectopic pregnancy; Supervision of low-risk pregnancy; and Advanced maternal age in multigravida on their problem list.  Patient reports occasional contractions.  Contractions: Irritability. Vag. Bleeding: None.  Movement: Present. Denies leaking of fluid.   The following portions of the patient's history were reviewed and updated as appropriate: allergies, current medications, past family history, past medical history, past social history, past surgical history and problem list.   Objective:   Vitals:   08/26/22 1622  BP: 101/61  Pulse: 71  Weight: 199 lb (90.3 kg)    Fetal Status: Fetal Heart Rate (bpm): 138 Fundal Height: 37 cm Movement: Present     General:  Alert, oriented and cooperative. Patient is in no acute distress.  Skin: Skin is warm and dry. No rash noted.   Cardiovascular: Normal heart rate noted  Respiratory: Normal respiratory effort, no problems with respiration noted  Abdomen: Soft, gravid, appropriate for gestational age.  Pain/Pressure: Present     Pelvic: Cervical exam deferred        Extremities: Normal range of motion.  Edema: Trace  Mental Status: Normal mood and affect. Normal behavior. Normal judgment and thought content.   Assessment and Plan:  Pregnancy: [id number] at [redacted]w[redacted]d 1. Encounter for supervision of low-risk pregnancy in third trimester - Doing well, feeling regular and vigorous fetal movement   2. [redacted] weeks gestation of pregnancy - Routine OB care   3. Multigravida of advanced maternal age in third trimester - Took daily low dose aspirin throughout pregnancy  Term labor symptoms and general obstetric precautions including but not limited to vaginal bleeding, contractions, leaking of  fluid and fetal movement were reviewed in detail with the patient. Please refer to After Visit Summary for other counseling recommendations.   Return in about 1 week (around 09/02/2022) for IN-PERSON, LOB.  Future Appointments  Date Time Provider [place]  09/02/2022  4:15 PM Gabriel Carina, CNM [facility]   Gabriel Carina, CNM

## 2022-08-27 ENCOUNTER — Encounter: Payer: Self-pay | Admitting: Certified Nurse Midwife

## 2022-09-02 ENCOUNTER — Other Ambulatory Visit: Payer: Self-pay

## 2022-09-02 ENCOUNTER — Ambulatory Visit (INDEPENDENT_AMBULATORY_CARE_PROVIDER_SITE_OTHER): Payer: BC Managed Care – PPO | Admitting: Certified Nurse Midwife

## 2022-09-02 VITALS — BP 112/63 | HR 76 | Wt 202.0 lb

## 2022-09-02 DIAGNOSIS — Z3A38 38 weeks gestation of pregnancy: Secondary | ICD-10-CM

## 2022-09-02 DIAGNOSIS — Z3493 Encounter for supervision of normal pregnancy, unspecified, third trimester: Secondary | ICD-10-CM

## 2022-09-02 NOTE — Progress Notes (Signed)
   PRENATAL VISIT NOTE  Subjective:  Kelsey Campbell is a 36 y.o. [id number] at [redacted]w[redacted]d being seen today for ongoing prenatal care.  She is currently monitored for the following issues for this low-risk pregnancy and has History of reversal of tubal ligation; History of ectopic pregnancy; Supervision of low-risk pregnancy; and Advanced maternal age in multigravida on their problem list.  Patient reports occasional contractions.  Contractions: Irregular. Vag. Bleeding: None.  Movement: Present. Denies leaking of fluid.   The following portions of the patient's history were reviewed and updated as appropriate: allergies, current medications, past family history, past medical history, past social history, past surgical history and problem list.   Objective:   Vitals:   09/02/22 1619  BP: 112/63  Pulse: 76  Weight: 202 lb (91.6 kg)    Fetal Status: Fetal Heart Rate (bpm): 132 Fundal Height: 38 cm Movement: Present     General:  Alert, oriented and cooperative. Patient is in no acute distress.  Skin: Skin is warm and dry. No rash noted.   Cardiovascular: Normal heart rate noted  Respiratory: Normal respiratory effort, no problems with respiration noted  Abdomen: Soft, gravid, appropriate for gestational age.  Pain/Pressure: Present     Pelvic: Cervical exam deferred        Extremities: Normal range of motion.  Edema: Trace  Mental Status: Normal mood and affect. Normal behavior. Normal judgment and thought content.   Assessment and Plan:  Pregnancy: [id number] at [redacted]w[redacted]d 1. Encounter for supervision of low-risk pregnancy in third trimester - Doing well, feeling regular and vigorous fetal movement   2. [redacted] weeks gestation of pregnancy - Routine OB care   Term labor symptoms and general obstetric precautions including but not limited to vaginal bleeding, contractions, leaking of fluid and fetal movement were reviewed in detail with the patient. Please refer to After Visit Summary for other  counseling recommendations.   Return in about 1 week (around 09/09/2022) for IN-PERSON, LOB.  Future Appointments  Date Time Provider [place]  09/09/2022  1:55 PM Helaine Chess [facility]    Gabriel Carina, CNM

## 2022-09-09 ENCOUNTER — Other Ambulatory Visit: Payer: Self-pay

## 2022-09-09 ENCOUNTER — Ambulatory Visit (INDEPENDENT_AMBULATORY_CARE_PROVIDER_SITE_OTHER): Payer: BC Managed Care – PPO | Admitting: Certified Nurse Midwife

## 2022-09-09 VITALS — BP 96/65 | HR 90 | Wt 202.2 lb

## 2022-09-09 DIAGNOSIS — Z3493 Encounter for supervision of normal pregnancy, unspecified, third trimester: Secondary | ICD-10-CM

## 2022-09-09 DIAGNOSIS — O09523 Supervision of elderly multigravida, third trimester: Secondary | ICD-10-CM

## 2022-09-09 DIAGNOSIS — Z3A39 39 weeks gestation of pregnancy: Secondary | ICD-10-CM

## 2022-09-10 NOTE — Progress Notes (Signed)
   PRENATAL VISIT NOTE  Subjective:  Kelsey Campbell is a 37 y.o. [id number] at [redacted]w[redacted]d being seen today for ongoing prenatal care.  She is currently monitored for the following issues for this low-risk pregnancy and has History of reversal of tubal ligation; History of ectopic pregnancy; Supervision of low-risk pregnancy; and Advanced maternal age in multigravida on their problem list.  Patient reports no complaints.  Contractions: Irritability. Vag. Bleeding: None.  Movement: Present. Denies leaking of fluid.   The following portions of the patient's history were reviewed and updated as appropriate: allergies, current medications, past family history, past medical history, past social history, past surgical history and problem list.   Objective:   Vitals:   09/09/22 1405  BP: 96/65  Pulse: 90  Weight: 202 lb 3.2 oz (91.7 kg)    Fetal Status: Fetal Heart Rate (bpm): 128   Movement: Present     General:  Alert, oriented and cooperative. Patient is in no acute distress.  Skin: Skin is warm and dry. No rash noted.   Cardiovascular: Normal heart rate noted  Respiratory: Normal respiratory effort, no problems with respiration noted  Abdomen: Soft, gravid, appropriate for gestational age.  Pain/Pressure: Present     Pelvic: Cervical exam deferred        Extremities: Normal range of motion.  Edema: Moderate pitting, indentation subsides rapidly  Mental Status: Normal mood and affect. Normal behavior. Normal judgment and thought content.   Assessment and Plan:  Pregnancy: [id number] at [redacted]w[redacted]d 1. Encounter for supervision of low-risk pregnancy in third trimester - Doing well, feeling regular and vigorous fetal movement   2. [redacted] weeks gestation of pregnancy - Routine OB care  - Strongly desires to avoid IOL, amenable to membrane sweep next week + IOL in the 41st week of pregnancy  3. Multigravida of advanced maternal age in third trimester - Took low dose aspirin throughout pregnancy  Term  labor symptoms and general obstetric precautions including but not limited to vaginal bleeding, contractions, leaking of fluid and fetal movement were reviewed in detail with the patient. Please refer to After Visit Summary for other counseling recommendations.   Return in about 1 week (around 09/16/2022) for LOB, IN-PERSON, NST/BPP.  Future Appointments  Date Time Provider Department Center  09/16/2022  3:15 PM [facility] NST [facility]  09/16/2022  3:55 PM Bernerd Limbo, CNM [facility]    Bernerd Limbo, CNM

## 2022-09-14 ENCOUNTER — Inpatient Hospital Stay (HOSPITAL_COMMUNITY): Admission: AD | Admit: 2022-09-14 | Payer: BC Managed Care – PPO | Source: Home / Self Care

## 2022-09-16 ENCOUNTER — Other Ambulatory Visit: Payer: Self-pay

## 2022-09-16 ENCOUNTER — Other Ambulatory Visit (INDEPENDENT_AMBULATORY_CARE_PROVIDER_SITE_OTHER): Payer: BC Managed Care – PPO

## 2022-09-16 ENCOUNTER — Ambulatory Visit (INDEPENDENT_AMBULATORY_CARE_PROVIDER_SITE_OTHER): Payer: BC Managed Care – PPO | Admitting: Certified Nurse Midwife

## 2022-09-16 ENCOUNTER — Ambulatory Visit (INDEPENDENT_AMBULATORY_CARE_PROVIDER_SITE_OTHER): Payer: BC Managed Care – PPO | Admitting: General Practice

## 2022-09-16 ENCOUNTER — Other Ambulatory Visit: Payer: Self-pay | Admitting: Advanced Practice Midwife

## 2022-09-16 VITALS — BP 111/70 | HR 91 | Wt 204.0 lb

## 2022-09-16 DIAGNOSIS — O480 Post-term pregnancy: Secondary | ICD-10-CM

## 2022-09-16 DIAGNOSIS — Z3A40 40 weeks gestation of pregnancy: Secondary | ICD-10-CM | POA: Diagnosis not present

## 2022-09-16 DIAGNOSIS — Z3A00 Weeks of gestation of pregnancy not specified: Secondary | ICD-10-CM

## 2022-09-16 DIAGNOSIS — Z3493 Encounter for supervision of normal pregnancy, unspecified, third trimester: Secondary | ICD-10-CM

## 2022-09-16 NOTE — Progress Notes (Signed)
Pt informed that the ultrasound is considered a limited OB ultrasound and is not intended to be a complete ultrasound exam.  Patient also informed that the ultrasound is not being completed with the intent of assessing for fetal or placental anomalies or any pelvic abnormalities.  Explained that the purpose of today's ultrasound is to assess for  BPP, presentation, and AFI.  Patient acknowledges the purpose of the exam and the limitations of the study.  Imran Nuon H RN BSN 09/16/22     

## 2022-09-17 ENCOUNTER — Encounter (HOSPITAL_COMMUNITY): Payer: Self-pay

## 2022-09-17 ENCOUNTER — Telehealth (HOSPITAL_COMMUNITY): Payer: Self-pay | Admitting: *Deleted

## 2022-09-17 ENCOUNTER — Encounter: Payer: Self-pay | Admitting: Certified Nurse Midwife

## 2022-09-17 NOTE — Telephone Encounter (Signed)
Preadmission screen  

## 2022-09-18 ENCOUNTER — Inpatient Hospital Stay (HOSPITAL_COMMUNITY)
Admission: RE | Admit: 2022-09-18 | Discharge: 2022-09-20 | DRG: 807 | Disposition: A | Discharge status: Home / Self Care | Payer: BC Managed Care – PPO | Attending: Obstetrics and Gynecology | Admitting: Obstetrics and Gynecology

## 2022-09-18 DIAGNOSIS — O99214 Obesity complicating childbirth: Secondary | ICD-10-CM | POA: Diagnosis not present

## 2022-09-18 DIAGNOSIS — O480 Post-term pregnancy: Principal | ICD-10-CM | POA: Diagnosis present

## 2022-09-18 DIAGNOSIS — Z7982 Long term (current) use of aspirin: Secondary | ICD-10-CM | POA: Diagnosis not present

## 2022-09-18 DIAGNOSIS — Z3A40 40 weeks gestation of pregnancy: Secondary | ICD-10-CM

## 2022-09-18 DIAGNOSIS — Z87891 Personal history of nicotine dependence: Secondary | ICD-10-CM

## 2022-09-18 DIAGNOSIS — E669 Obesity, unspecified: Secondary | ICD-10-CM | POA: Diagnosis not present

## 2022-09-18 DIAGNOSIS — O09523 Supervision of elderly multigravida, third trimester: Secondary | ICD-10-CM | POA: Diagnosis not present

## 2022-09-18 DIAGNOSIS — O9902 Anemia complicating childbirth: Secondary | ICD-10-CM | POA: Diagnosis not present

## 2022-09-18 DIAGNOSIS — D649 Anemia, unspecified: Secondary | ICD-10-CM | POA: Diagnosis not present

## 2022-09-18 DIAGNOSIS — Z3490 Encounter for supervision of normal pregnancy, unspecified, unspecified trimester: Principal | ICD-10-CM | POA: Diagnosis present

## 2022-09-18 LAB — TYPE AND SCREEN
ABO/RH(D): A POS
Antibody Screen: NEGATIVE

## 2022-09-18 LAB — CBC
HCT: 33.7 % — ABNORMAL LOW (ref 36.0–46.0)
Hemoglobin: 11.4 g/dL — ABNORMAL LOW (ref 12.0–15.0)
MCH: 32.9 pg (ref 26.0–34.0)
MCHC: 33.8 g/dL (ref 30.0–36.0)
MCV: 97.4 fL (ref 80.0–100.0)
Platelets: 216 10*3/uL (ref 150–400)
RBC: 3.46 MIL/uL — ABNORMAL LOW (ref 3.87–5.11)
RDW: 12.9 % (ref 11.5–15.5)
WBC: 10.6 10*3/uL — ABNORMAL HIGH (ref 4.0–10.5)
nRBC: 0 % (ref 0.0–0.2)

## 2022-09-18 LAB — RPR: RPR Ser Ql: NONREACTIVE

## 2022-09-18 MED ORDER — LACTATED RINGERS IV SOLN: INTRAVENOUS | Status: DC

## 2022-09-18 MED ORDER — ACETAMINOPHEN 325 MG PO TABS: 650.0000 mg | ORAL_TABLET | ORAL | Status: DC | PRN

## 2022-09-18 MED ORDER — OXYTOCIN-SODIUM CHLORIDE 30-0.9 UT/500ML-% IV SOLN
1.0000 m[IU]/min | INTRAVENOUS | Status: DC
  Administered 2022-09-18 – 2022-09-19 (×2): 2 m[IU]/min via INTRAVENOUS
  Filled 2022-09-18: qty 500

## 2022-09-18 MED ORDER — OXYCODONE-ACETAMINOPHEN 5-325 MG PO TABS: 2.0000 | ORAL_TABLET | ORAL | Status: DC | PRN

## 2022-09-18 MED ORDER — FENTANYL CITRATE (PF) 100 MCG/2ML IJ SOLN: 50.0000 ug | INTRAMUSCULAR | Status: DC | PRN

## 2022-09-18 MED ORDER — OXYCODONE-ACETAMINOPHEN 5-325 MG PO TABS: 1.0000 | ORAL_TABLET | ORAL | Status: DC | PRN

## 2022-09-18 MED ORDER — MISOPROSTOL 25 MCG QUARTER TABLET
25.0000 ug | ORAL_TABLET | ORAL | Status: DC | PRN
  Administered 2022-09-18 (×2): 25 ug via VAGINAL
  Filled 2022-09-18 (×2): qty 1

## 2022-09-18 MED ORDER — SOD CITRATE-CITRIC ACID 500-334 MG/5ML PO SOLN
30.0000 mL | ORAL | Status: DC | PRN
  Administered 2022-09-19: 30 mL via ORAL
  Filled 2022-09-18: qty 30

## 2022-09-18 MED ORDER — TERBUTALINE SULFATE 1 MG/ML IJ SOLN: 0.2500 mg | Freq: Once | INTRAMUSCULAR | Status: DC | PRN

## 2022-09-18 MED ORDER — ONDANSETRON HCL 4 MG/2ML IJ SOLN: 4.0000 mg | Freq: Four times a day (QID) | INTRAMUSCULAR | Status: DC | PRN

## 2022-09-18 MED ORDER — OXYTOCIN-SODIUM CHLORIDE 30-0.9 UT/500ML-% IV SOLN
2.5000 [IU]/h | INTRAVENOUS | Status: DC
  Administered 2022-09-19: 2.5 [IU]/h via INTRAVENOUS

## 2022-09-18 MED ORDER — LIDOCAINE HCL (PF) 1 % IJ SOLN: 30.0000 mL | INTRAMUSCULAR | Status: DC | PRN

## 2022-09-18 MED ORDER — LACTATED RINGERS IV SOLN: 500.0000 mL | INTRAVENOUS | Status: DC | PRN

## 2022-09-18 MED ORDER — OXYTOCIN BOLUS FROM INFUSION
333.0000 mL | Freq: Once | INTRAVENOUS | Status: AC
  Administered 2022-09-19: 333 mL via INTRAVENOUS

## 2022-09-18 NOTE — Progress Notes (Signed)
Kelsey Campbell is a 37 y.o. [id number] at [redacted]w[redacted]d  Subjective: Reports recurrent painful contractions overnight. Did not feel contractions when ambulating on unit a moment ago and not currently uncomfortable.   Objective: BP 111/71   Pulse 84   Temp 97.9 F (36.6 C) (Oral)   Resp 16   Ht  (1.626 m)   Wt 93.8 kg   LMP 12/02/2021 (Approximate)   BMI 35.50 kg/m  No intake/output data recorded. No intake/output data recorded.  Most recent NST ended at 0337 FHT:  Baseline 125, mod var, + 15 x 15 accels, no decels UC:   Previously q 2 minutes per NST, now irregular and occasional SVE:   Dilation: 1 Effacement (%): 50 Station: -3 Exam by:: Dr. Isac Caddy  Labs: Lab Results  Component Value Date   WBC 10.6 (H) 09/18/2022   HGB 11.4 (L) 09/18/2022   HCT 33.7 (L) 09/18/2022   MCV 97.4 09/18/2022   PLT 216 09/18/2022    Assessment / Plan: --37 y.o. .[id number] at [redacted]w[redacted]d admitted for IOL, planning waterbirth --Previously Cat 1, normal FHT via Doppler since 0330 --GBS Neg  Labor:  --S/p Foley bulb, Cytotec 25 mcg given per vag at 0137. Patient with occasional contractions.  --Patient to have light labor breakfast, then CNM will check cervix, assess for additional Cyotec vs AROM vs Pit --For waterbirth, will sign consent with next exam  [name], CNM 09/18/2022, 8:38 AM

## 2022-09-18 NOTE — Progress Notes (Signed)
Kelsey Campbell is a 37 y.o. [id number] at [redacted]w[redacted]d   Subjective: Pain score 3/10. Doula at bedside and supportive  Objective: BP (!) 101/51   Pulse 76   Temp 98.2 F (36.8 C) (Oral)   Resp 16   Ht  (1.626 m)   Wt 93.8 kg   LMP 12/02/2021 (Approximate)   BMI 35.50 kg/m  No intake/output data recorded. No intake/output data recorded.  FHT:  Baseline 145, mod var, + accels, no decels UC:   q 2-5 min SVE:   Dilation: 3.5 Effacement (%): 60 Station: -2 Exam by:: Sam Khang Hannum,CNM  Labs: Lab Results  Component Value Date   WBC 10.6 (H) 09/18/2022   HGB 11.4 (L) 09/18/2022   HCT 33.7 (L) 09/18/2022   MCV 97.4 09/18/2022   PLT 216 09/18/2022    Assessment / Plan: --37 y.o. [id number] at [redacted]w[redacted]d --Cat I tracing --Pitocin infusing at 8 milliunits --Cervix with small change in 4 hours but fetal head now well applied --Discussed AROM now vs in 4 hours, expected impact on labor course and pain score --Patient elects to defer AROM until next exam --Anticipate vaginal birth/waterbirth  [name], CNM 09/18/2022, 6:52 PM

## 2022-09-18 NOTE — Progress Notes (Addendum)
Labor Progress Note  Kelsey Campbell is a 37 y.o. [id number] at [redacted]w[redacted]d presented for eIOL  S: pt doing well  O:  BP (!) 110/52   Pulse 64   Temp 98 F (36.7 C) (Oral)   Resp 17   Ht  (1.626 m)   Wt 93.8 kg   LMP 12/02/2021 (Approximate)   BMI 35.50 kg/m  EFM: 135 bpm/Moderate variability/ 15x15 accels/ None decels  CVE: Dilation: 1 Effacement (%): 50 Station: -3 Presentation: Vertex Exam by:: Dr. Isac Caddy   A&P: 37 y.o. [id number] [redacted]w[redacted]d  here for IOL as above  #Labor: Progressing well. Intermittent monitor. S/p cyotec and still contracting q 2-5 min. Will hold off on augmentation until contractions space out a bit. FB still in place. Anticipate waterbirth #Pain: Nitrous oxide and Family/Friend support #FWB: CAT 1 #GBS negative  Myrtie Hawk, DO FMOB Fellow, Faculty practice [facility], [facility] for [facility] 09/18/22  7:06 AM     '

## 2022-09-18 NOTE — Progress Notes (Signed)
Kelsey Campbell is a 37 y.o. [id number] at [redacted]w[redacted]d   Subjective: Comfortable in bed, feels pain through contractions   Objective: BP 115/75   Pulse 99   Temp 97.6 F (36.4 C) (Oral)   Resp 17   Ht  (1.626 m)   Wt 93.8 kg   LMP 12/02/2021 (Approximate)   BMI 35.50 kg/m  No intake/output data recorded. No intake/output data recorded.  FHT:  Baseline 145, mod var, + accels, no decels UC:   q 2-5 min SVE:   Dilation: 4 Effacement (%): 70 Station: -2 Exam by:: Dr. Nobie Putnam  Labs: Lab Results  Component Value Date   WBC 10.6 (H) 09/18/2022   HGB 11.4 (L) 09/18/2022   HCT 33.7 (L) 09/18/2022   MCV 97.4 09/18/2022   PLT 216 09/18/2022    Assessment / Plan: --37 y.o. [id number] at [redacted]w[redacted]d --Cat I tracing --Pitocin infusing at 8 milliunits --Little change from previous check, AROM with light meconium fluid.  --Anticipate vaginal birth/waterbirth  Celedonio Savage, MD 09/18/2022, 9:57 PM

## 2022-09-18 NOTE — H&P (Signed)
OBSTETRIC ADMISSION HISTORY AND PHYSICAL  Kelsey Campbell is a 37 y.o. female [phone] with IUP at [redacted]w[redacted]d by [place] presenting for eIOL. She reports +FMs, No LOF, no VB, no blurry vision, headaches or peripheral edema, and RUQ pain.  She plans on breast feeding. She request vasectomy for birth control. She received her prenatal care at [facility]   Dating: By [place] --->  Estimated Date of Delivery: 09/14/22  Sono:    , CWD, normal anatomy, cephalic presentation   Prenatal History/Complications:  Patient Active Problem List   Diagnosis Date Noted   Encounter for induction of labor 09/18/2022   Advanced maternal age in multigravida 08/26/2022   Supervision of low-risk pregnancy 01/27/2022   History of reversal of tubal ligation    History of ectopic pregnancy     Nursing Staff Provider  Office Location [facility] Dating  09/14/2022, by Ultrasound  [facility] Model [ x] Traditional  Centering  Mom-Baby Dyad    Language  English Anatomy US  Normal  Flu Vaccine  03/26/2022 Genetic/Carrier Screen  NIPS: Normal female AFP: Negative Horizon:  TDaP Vaccine   06/15/2022 Hgb A1C or  GTT Early  Third trimester: 88/165/139 (normal)  COVID Vaccine    LAB RESULTS   Rhogam  A/Positive/-- (09/18 1528)  Blood Type A/Positive/-- (09/18 1528)   Baby Feeding Plan Breast  Antibody Negative (09/18 1528)  Contraception Vasectomy  Rubella 2.09 (09/18 1528)  Circumcision Boy Yes  RPR Non Reactive (01/25 0823)   Pediatrician   HBsAg Negative (09/18 1528)   Support Person FOB and Doula  HCVAb Non Reactive (09/18 1528)   Prenatal Classes  HIV Non Reactive (01/25 6387)     BTL Consent  GBS Negative/-- (03/21 0753) (For PCN allergy, check sensitivities)   VBAC Consent  Pap Diagnosis  Date Value Ref Range Status  02/25/2022   Final   - Negative for intraepithelial lesion or malignancy (NILM)         DME Rx  BP cuff  Weight Scale Waterbirth   Class  CNM visit  PHQ9 & GAD7 [  ] new OB [  ] 28  weeks  [  ] 36 weeks Induction   Orders Entered Foley Y/N      Past Medical History: No past medical history on file.  Past Surgical History: Past Surgical History:  Procedure Laterality Date   LAPAROSCOPIC OVARIAN CYSTECTOMY N/A 05/03/2019   Procedure: LAPAROSCOPIC SALPINGECTOMY;  Surgeon: Schermerhorn, Ihor Austin, MD;  Location: [facility] ORS;  Service: Gynecology;  Laterality: N/A;   TUBAL LIGATION     tubal ligation reversal     WISDOM TOOTH EXTRACTION     one; early 38s    Obstetrical History: OB History     Gravida  4   Para  2   Term  2   Preterm      AB  1   Living  2      SAB  0   IAB      Ectopic  1   Multiple      Live Births  2           Social History Social History   Socioeconomic History   Marital status: Married    Spouse name: Molly Maduro   Number of children: 2   Years of education: 16   Highest education level: Not on file  Occupational History   Occupation: Mining engineer for [employer]  Tobacco Use  Smoking status: Former   Smokeless tobacco: Never  Building services engineer Use: Never used  Substance and Sexual Activity   Alcohol use: Not Currently    Comment: occasionally   Drug use: Not Currently   Sexual activity: Yes    Partners: Male    Birth control/protection: None    Comment: tubal reversal in 2020  Other Topics Concern   Not on file  Social History Narrative   Not on file   Social Determinants of Health   Financial Resource Strain: Low Risk  (01/27/2022)   Overall Financial Resource Strain (CARDIA)    Difficulty of Paying Living Expenses: Not hard at all  Food Insecurity: No Food Insecurity (01/27/2022)   Hunger Vital Sign    Worried About Running Out of Food in the Last Year: Never true    Ran Out of Food in the Last Year: Never true  Transportation Needs: No Transportation Needs (01/27/2022)   PRAPARE - Administrator, Civil Service (Medical): No    Lack of Transportation  (Non-Medical): No  Physical Activity: Sufficiently Active (01/27/2022)   Exercise Vital Sign    Days of Exercise per Week: 4 days    Minutes of Exercise per Session: 60 min  Stress: No Stress Concern Present (01/27/2022)   [employer] of Occupational Health - Occupational Stress Questionnaire    Feeling of Stress : Not at all  Social Connections: Unknown (01/27/2022)   Social Connection and Isolation Panel [NHANES]    Frequency of Communication with Friends and Family: Once a week    Frequency of Social Gatherings with Friends and Family: Not on file    Attends Religious Services: More than 4 times per year    Active Member of [employer] or Organizations: Yes    Attends Engineer, structural: More than 4 times per year    Marital Status: Married    Family History: Family History  Problem Relation Age of Onset   Diabetes Mother        pre diabetic   Fibromyalgia Mother    Eczema Maternal Grandmother    Cancer Maternal Grandmother 6       stage 4 - unsure of what kind   Heart attack Maternal Grandmother    Cancer Maternal Grandfather 50       prostate; throat - 68   Diabetes Maternal Grandfather    Cancer Paternal Grandfather        passed away in his 70s; kind unknown by pt   Asthma Half-Sister        maternal half sister    Allergies: Allergies  Allergen Reactions   Sulfamethoxazole-Trimethoprim Itching and Rash    Medications Prior to Admission  Medication Sig Dispense Refill Last Dose   aspirin EC 81 MG tablet Take 81 mg by mouth daily. Swallow whole.      omeprazole (PRILOSEC) 40 MG capsule Take 1 capsule (40 mg total) by mouth daily. 30 capsule 2    polyethylene glycol powder (GLYCOLAX/MIRALAX) 17 GM/SCOOP powder Take by mouth as needed.      Prenatal Vit-Fe Fumarate-FA (MULTIVITAMIN-PRENATAL) 27-0.8 MG TABS tablet Take 1 tablet by mouth daily at 12 noon.        Review of Systems   All systems reviewed and negative except as stated in HPI  Last  menstrual period 12/02/2021, unknown if currently breastfeeding. General appearance: alert, cooperative, and appears stated age Lungs: clear to auscultation bilaterally Heart: regular rate and rhythm Abdomen: soft, non-tender;  bowel sounds normal Pelvic: no lesions Extremities: Homans sign is negative, no sign of DVT Presentation: cephalic Fetal monitoringBaseline: 130 bpm, Variability: Good {> 6 bpm), Accelerations: Reactive, and Decelerations: Absent Uterine activity: q 2-51min     Prenatal labs: ABO, Rh: A/Positive/-- (09/18 1528) Antibody: Negative (09/18 1528) Rubella: 2.09 (09/18 1528) RPR: Non Reactive (01/25 0823)  HBsAg: Negative (09/18 1528)  HIV: Non Reactive (01/25 0823)  GBS: Negative/-- (03/21 0753)  1 hr Glucola nml Genetic screening  nml Anatomy US nml  Prenatal Transfer Tool  Maternal Diabetes: No Genetic Screening: Normal Maternal Ultrasounds/Referrals: Normal Fetal Ultrasounds or other Referrals:  None Maternal Substance Abuse:  No Significant Maternal Medications:  None Significant Maternal Lab Results:  Group B Strep negative Number of Prenatal Visits:greater than 3 verified prenatal visits Other Comments:  None  No results found for this or any previous visit (from the past 24 hour(s)).  Patient Active Problem List   Diagnosis Date Noted   Encounter for induction of labor 09/18/2022   Advanced maternal age in multigravida 08/26/2022   Supervision of low-risk pregnancy 01/27/2022   History of reversal of tubal ligation    History of ectopic pregnancy     Assessment/Plan:  Rayette Mogg is a 37 y.o. [id number] at [redacted]w[redacted]d here for IOL elective  #Labor: FB in, Cytotec vaginally. Intermittent monitoring.  #Pain: Per pt request #FWB: CAT 1 #ID:  GBS neg #MOF: Breast #MOC:vasectomy #Circ:  yes  Myrtie Hawk, DO  09/18/2022, 12:35 AM

## 2022-09-18 NOTE — Progress Notes (Signed)
Kelsey Campbell is a 37 y.o. [id number] at [redacted]w[redacted]d   Subjective: Feeling well, most recent dose of Cytotec did not bring recurrent uncomfortable contractions like initial dose did. Patient has been on phone with her doula and feels ready for additional intervention e.g. Pitocin.   Objective: BP 112/67   Pulse 87   Temp 98.6 F (37 C) (Oral)   Resp 16   Ht  (1.626 m)   Wt 93.8 kg   LMP 12/02/2021 (Approximate)   BMI 35.50 kg/m  No intake/output data recorded. No intake/output data recorded.  FHT:  Baseline 150, mod var, + accels, no decels UC:   Irregular SVE:   Dilation: 3 Effacement (%): 50 Station: -2 Exam by:: Sam Westyn Driggers,CNM  Labs: Lab Results  Component Value Date   WBC 10.6 (H) 09/18/2022   HGB 11.4 (L) 09/18/2022   HCT 33.7 (L) 09/18/2022   MCV 97.4 09/18/2022   PLT 216 09/18/2022    Assessment / Plan: --37 y.o. [id number] at [redacted]w[redacted]d  --Cat I tracing --Cervix unchanged 4 hours s/p second dose of Cytotec and s/p FB --Patient agreeable to Pitocin 2 x 2 --Continues to desire waterbirth --Assess for AROM, Pit discontinuation with future exams to preserve waterbirth eligibility --Anticipate vaginal birth  [name], [place] 09/18/2022, 2:32 PM

## 2022-09-18 NOTE — Progress Notes (Signed)
Kelsey Campbell is a 37 y.o. [id number] at [redacted]w[redacted]d   Subjective: Sitting in throne, resting comfortably, partner at bedside and very supportive.   Objective: BP 106/68   Pulse 74   Temp 98.6 F (37 C) (Oral)   Resp 16   Ht  (1.626 m)   Wt 93.8 kg   LMP 12/02/2021 (Approximate)   BMI 35.50 kg/m  No intake/output data recorded. No intake/output data recorded.  FHT:  150, mod var, + accels, no decels UC:   q 2-4 min SVE:   Dilation: 3 Effacement (%): 50 Station: -2 Exam by:: Sam Candiss Galeana,CNM  Labs: Lab Results  Component Value Date   WBC 10.6 (H) 09/18/2022   HGB 11.4 (L) 09/18/2022   HCT 33.7 (L) 09/18/2022   MCV 97.4 09/18/2022   PLT 216 09/18/2022    Assessment / Plan: --37 y.o. [id number] at [redacted]w[redacted]d  --Cat I tracing --S/p FB --Pitocin 2 x 2 initiated at 1445, infusing at 2 milliunits --Plan to check cervix at 1930 unless otherwise indicated by change in acuity  [name], CNM 09/18/2022, 4:08 PM

## 2022-09-18 NOTE — Progress Notes (Signed)
Kelsey Campbell is a 37 y.o. [id number] at [redacted]w[redacted]d   Subjective: Rare contractions, ready for next steps  Objective: BP 103/65   Pulse 84   Temp 97.9 F (36.6 C) (Oral)   Resp 16   Ht  (1.626 m)   Wt 93.8 kg   LMP 12/02/2021 (Approximate)   BMI 35.50 kg/m  No intake/output data recorded. No intake/output data recorded.  FHT:  Dopp tones WNL SVE:   Dilation: 3 Effacement (%): 50 Station: -2 Exam by:: Sam Syana Degraffenreid,CNM  Labs: Lab Results  Component Value Date   WBC 10.6 (H) 09/18/2022   HGB 11.4 (L) 09/18/2022   HCT 33.7 (L) 09/18/2022   MCV 97.4 09/18/2022   PLT 216 09/18/2022    Assessment / Plan: --37 y.o. [id number] admitted for IOL --Cervix now 3/thick/-2, not well applied on maternal left --Advised vaginal Cytotec due to thickness --No recent NST, monitors applied to ensure Cat I --Assess for AROM when 4 hours from Cytotec --Waterbirth consent signed --Anticipate vaginal birth  [name], [place] 09/18/2022, 10:26 AM

## 2022-09-19 ENCOUNTER — Other Ambulatory Visit: Payer: Self-pay

## 2022-09-19 ENCOUNTER — Inpatient Hospital Stay (HOSPITAL_COMMUNITY): Payer: BC Managed Care – PPO | Admitting: Anesthesiology

## 2022-09-19 ENCOUNTER — Encounter (HOSPITAL_COMMUNITY): Payer: Self-pay | Admitting: Obstetrics and Gynecology

## 2022-09-19 DIAGNOSIS — Z3A40 40 weeks gestation of pregnancy: Secondary | ICD-10-CM

## 2022-09-19 DIAGNOSIS — O480 Post-term pregnancy: Secondary | ICD-10-CM | POA: Diagnosis not present

## 2022-09-19 DIAGNOSIS — O09523 Supervision of elderly multigravida, third trimester: Secondary | ICD-10-CM | POA: Diagnosis not present

## 2022-09-19 MED ORDER — IBUPROFEN 600 MG PO TABS
600.0000 mg | ORAL_TABLET | Freq: Four times a day (QID) | ORAL | Status: DC
  Administered 2022-09-19 – 2022-09-20 (×6): 600 mg via ORAL
  Filled 2022-09-19 (×6): qty 1

## 2022-09-19 MED ORDER — DIPHENHYDRAMINE HCL 25 MG PO CAPS: 25.0000 mg | ORAL_CAPSULE | Freq: Four times a day (QID) | ORAL | Status: DC | PRN

## 2022-09-19 MED ORDER — FENTANYL-BUPIVACAINE-NACL 0.5-0.125-0.9 MG/250ML-% EP SOLN
12.0000 mL/h | EPIDURAL | Status: DC | PRN
  Administered 2022-09-19: 12 mL/h via EPIDURAL

## 2022-09-19 MED ORDER — LIDOCAINE-EPINEPHRINE (PF) 1.5 %-1:200000 IJ SOLN
INTRAMUSCULAR | Status: DC | PRN
  Administered 2022-09-19: 5 mL via EPIDURAL

## 2022-09-19 MED ORDER — ONDANSETRON HCL 4 MG PO TABS: 4.0000 mg | ORAL_TABLET | ORAL | Status: DC | PRN

## 2022-09-19 MED ORDER — BENZOCAINE-MENTHOL 20-0.5 % EX AERO
1.0000 | INHALATION_SPRAY | CUTANEOUS | Status: DC | PRN
  Administered 2022-09-20: 1 via TOPICAL
  Filled 2022-09-19: qty 56

## 2022-09-19 MED ORDER — ONDANSETRON HCL 4 MG/2ML IJ SOLN: 4.0000 mg | INTRAMUSCULAR | Status: DC | PRN

## 2022-09-19 MED ORDER — PHENYLEPHRINE 80 MCG/ML (10ML) SYRINGE FOR IV PUSH (FOR BLOOD PRESSURE SUPPORT)
80.0000 ug | PREFILLED_SYRINGE | INTRAVENOUS | Status: DC | PRN
  Filled 2022-09-19: qty 10

## 2022-09-19 MED ORDER — LIDOCAINE HCL (PF) 1 % IJ SOLN
INTRAMUSCULAR | Status: DC | PRN
  Administered 2022-09-19: 5 mL via EPIDURAL

## 2022-09-19 MED ORDER — WITCH HAZEL-GLYCERIN EX PADS: 1.0000 | MEDICATED_PAD | CUTANEOUS | Status: DC | PRN

## 2022-09-19 MED ORDER — SENNOSIDES-DOCUSATE SODIUM 8.6-50 MG PO TABS
2.0000 | ORAL_TABLET | Freq: Every day | ORAL | Status: DC
  Administered 2022-09-20: 2 via ORAL
  Filled 2022-09-19: qty 2

## 2022-09-19 MED ORDER — DIBUCAINE (PERIANAL) 1 % EX OINT: 1.0000 | TOPICAL_OINTMENT | CUTANEOUS | Status: DC | PRN

## 2022-09-19 MED ORDER — FENTANYL-BUPIVACAINE-NACL 0.5-0.125-0.9 MG/250ML-% EP SOLN
EPIDURAL | Status: AC
  Filled 2022-09-19: qty 250

## 2022-09-19 MED ORDER — COCONUT OIL OIL: 1.0000 | TOPICAL_OIL | Status: DC | PRN

## 2022-09-19 MED ORDER — OXYCODONE HCL 5 MG PO TABS: 10.0000 mg | ORAL_TABLET | ORAL | Status: DC | PRN

## 2022-09-19 MED ORDER — ACETAMINOPHEN 325 MG PO TABS: 650.0000 mg | ORAL_TABLET | ORAL | Status: DC | PRN

## 2022-09-19 MED ORDER — OXYCODONE HCL 5 MG PO TABS: 5.0000 mg | ORAL_TABLET | ORAL | Status: DC | PRN

## 2022-09-19 MED ORDER — EPHEDRINE 5 MG/ML INJ: 10.0000 mg | INTRAVENOUS | Status: DC | PRN

## 2022-09-19 MED ORDER — SIMETHICONE 80 MG PO CHEW: 80.0000 mg | CHEWABLE_TABLET | ORAL | Status: DC | PRN

## 2022-09-19 MED ORDER — DIPHENHYDRAMINE HCL 50 MG/ML IJ SOLN
12.5000 mg | INTRAMUSCULAR | Status: DC | PRN
  Administered 2022-09-19: 12.5 mg via INTRAVENOUS
  Filled 2022-09-19: qty 1

## 2022-09-19 MED ORDER — PHENYLEPHRINE 80 MCG/ML (10ML) SYRINGE FOR IV PUSH (FOR BLOOD PRESSURE SUPPORT): 80.0000 ug | PREFILLED_SYRINGE | INTRAVENOUS | Status: DC | PRN

## 2022-09-19 MED ORDER — LACTATED RINGERS IV SOLN: 500.0000 mL | Freq: Once | INTRAVENOUS | Status: DC

## 2022-09-19 MED ORDER — PRENATAL MULTIVITAMIN CH
1.0000 | ORAL_TABLET | Freq: Every day | ORAL | Status: DC
  Administered 2022-09-19 – 2022-09-20 (×2): 1 via ORAL
  Filled 2022-09-19 (×2): qty 1

## 2022-09-19 NOTE — Anesthesia Procedure Notes (Signed)
Epidural Patient location during procedure: OB Start time: 09/19/2022 2:10 AM End time: 09/19/2022 2:20 AM  Staffing Anesthesiologist: Leonides Grills, MD Performed: anesthesiologist   Preanesthetic Checklist Completed: patient identified, IV checked, site marked, risks and benefits discussed, monitors and equipment checked, pre-op evaluation and timeout performed  Epidural Patient position: sitting Prep: DuraPrep Patient monitoring: heart rate, cardiac monitor, continuous pulse ox and blood pressure Approach: midline Location: L4-L5 Injection technique: LOR air  Needle:  Needle type: Tuohy  Needle gauge: 17 G Needle length: 9 cm Needle insertion depth: 5 cm Catheter type: closed end flexible Catheter size: 19 Gauge Catheter at skin depth: 10 cm Test dose: negative and 1.5% lidocaine with Epi 1:200 K  Assessment Events: blood not aspirated, no cerebrospinal fluid, injection not painful, no injection resistance and negative IV test  Additional Notes Informed consent obtained prior to proceeding including risk of failure, 1% risk of PDPH, risk of minor discomfort and bruising. Discussed alternatives to epidural analgesia and patient desires to proceed.  Timeout performed pre-procedure verifying patient name, procedure, and platelet count.  Patient tolerated procedure well. Reason for block:procedure for pain

## 2022-09-19 NOTE — Progress Notes (Signed)
Patient ID: Kelsey Campbell, female   DOB: March 22, 1986, 37 y.o.   MRN: [id number]  Labor Progress Note Kelsey Campbell is a 37 y.o. [id number] at [redacted]w[redacted]d presented for post-dates IOL, desiring waterbirth, now epiduralized.  S:  Resting well with epidural, not feeling any contractions.  O:  BP (!) 100/56   Pulse 83   Temp 97.7 F (36.5 C)   Resp 17   Ht  (1.626 m)   Wt 206 lb 12.8 oz (93.8 kg)   LMP 12/02/2021 (Approximate)   BMI 35.50 kg/m  EFM: baseline 140 bpm/ moderate variability/ 15x15 accels/ one decel with good return to baseline and variability Toco/IUPC: coupleted q5-108min  SVE: Dilation: 9 Dilation Complete Date: 09/19/22 Dilation Complete Time: 0021 Effacement (%): 80, 90 Station: 0 Presentation: Vertex Exam by:: K.Louis-Charles, RN Pitocin: None (to start 2x2)  A/P: 37 y.o. [id number] [redacted]w[redacted]d  1. Labor: Progressed rapidly from 4.5cm to 9.5cm with strong/involuntary urge to push. Opted for epidural pain relief and dilation has stalled.  2. FWB: Cat 1 overall 3. Pain: Well controlled with epidural on board  Will restart Pitocin titration and encouraged position changes. Anticipate SVD.  Bernerd Limbo, CNM 4:24 AM

## 2022-09-19 NOTE — Anesthesia Postprocedure Evaluation (Signed)
Anesthesia Post Note  Patient: Kelsey Campbell  Procedure(s) Performed: AN AD HOC LABOR EPIDURAL     Patient location during evaluation: Mother Baby Anesthesia Type: Epidural Level of consciousness: awake, oriented and awake and alert Pain management: pain level controlled Vital Signs Assessment: post-procedure vital signs reviewed and stable Respiratory status: spontaneous breathing, respiratory function stable and nonlabored ventilation Cardiovascular status: stable Postop Assessment: no headache, adequate PO intake, able to ambulate, patient able to bend at knees and no apparent nausea or vomiting Anesthetic complications: no   No notable events documented.  Last Vitals:  Vitals:   09/19/22 1057 09/19/22 1515  BP: 118/89 120/72  Pulse: 70 69  Resp: 18 18  Temp: 36.7 C 36.9 C  SpO2: 99% 100%    Last Pain:  Vitals:   09/19/22 1515  TempSrc: Oral  PainSc: 0-No pain   Pain Goal:                   Breton Berns

## 2022-09-19 NOTE — Progress Notes (Signed)
Patient ID: Kelsey Campbell, female   DOB: 08-Jul-1985, 37 y.o.   MRN: [id number]  Pt attempted not to push for about but then began involuntarily pushing with contractions. Checked cervix and she had more of anterior lip. Discussed importance of not continuing to push again the lip, risking swelling/tearing. Offered IV benadryl + nitrous to attempt not to push as well as epidural. Pt opted for epidural so she can rest well, prefers not to be very sleepy.  Assisted her out of the tub and used sifting technique to help her get through contractions until the anesthesiologist arrived for epidural placement. Will recheck cervix once she's comfortable and discuss next steps.  Edd Arbour, CNM, MSN, IBCLC Certified Nurse Midwife, [facility]

## 2022-09-19 NOTE — Progress Notes (Signed)
Patient ID: Kelsey Campbell, female   DOB: 07/02/1985, 37 y.o.   MRN: [id number]  Called to room, pt resting in bed but had urge to poop while taking a shower. Coping well with contractions with doula and FOB at bedside for support.  Cervical exam: 4.5, 0/-1, 90%. Pt strongly desires to get in the tub for relief. Only light meconium noted on AROM with Cat 1 tracing. Discussed possibility of contractions slowing once she gets in, she understands but wants to try the water.  Pt to get in tub, RN to watch for urge to push and call. MD aware pt is in tub at 4cm.  Edd Arbour, CNM, MSN, IBCLC Certified Nurse Midwife, [facility]

## 2022-09-19 NOTE — Lactation Note (Signed)
This note was copied from a baby's chart. Lactation Consultation Note  Patient Name: Kelsey Campbell [id number] Date: 09/19/2022 Age:39 hours Reason for consult: Initial assessment;Term   Maternal Data Does the patient have breastfeeding experience prior to this delivery?: Yes How long did the patient breastfeed?: 6 months with 1st child, and 8 weeks with the 2nd child   LC entered room and parents were resting. LC asked if it was okay to review some breastfeeding information and answer any questions. MOB stated it was for [facility] to enter. Reviewed normal newborn behaviors within the first 24 hours of life, frequency of feedings q2-3hrs 8-12 times in a 24 hr period based on infant feeding/hunger cues, and  infant output expectations on Day 1.  MOB stated a hand pump was given to her by her Nurse but was interested in an DEBP as well. LC brought equipment and supplies for DEBP and to educate on pump setup, maintenance, and cleaning. MOB stated she was tired. LC stated Lactation will come at a later time to educate pump use and storage guidelines.    LC encouraged MOB to call out to Lactation services if there are any questions of concerns.   Feeding Mother's Current Feeding Choice: Breast Milk   LC did not observe a feeding or pumping session at this time.   LATCH Score                    Lactation Tools Discussed/Used    Interventions Interventions: Breast feeding basics reviewed;Education;DEBP;LC Services brochure  Discharge Pump: Manual;DEBP;Personal (MOB not sure the name of the personal breast pump she has at home) [facility] Program: No  Consult Status Consult Status: Follow-up Follow-up type: In-patient    Jon Gills 09/19/2022, 11:30 AM

## 2022-09-19 NOTE — Anesthesia Preprocedure Evaluation (Signed)
Anesthesia Evaluation  Patient identified by MRN, date of birth, ID band Patient awake    Reviewed: Allergy & Precautions, H&P , NPO status , Patient's Chart, lab work & pertinent test results  History of Anesthesia Complications Negative for: history of anesthetic complications  Airway Mallampati: II  TM Distance: >3 FB Neck ROM: full    Dental no notable dental hx. (+) Teeth Intact   Pulmonary neg pulmonary ROS, former smoker   Pulmonary exam normal breath sounds clear to auscultation       Cardiovascular negative cardio ROS Normal cardiovascular exam Rhythm:regular Rate:Normal     Neuro/Psych negative neurological ROS  negative psych ROS   GI/Hepatic negative GI ROS, Neg liver ROS,,,  Endo/Other  negative endocrine ROS    Renal/GU negative Renal ROS  negative genitourinary   Musculoskeletal   Abdominal  (+) + obese  Peds  Hematology  (+) Blood dyscrasia, anemia   Anesthesia Other Findings   Reproductive/Obstetrics (+) Pregnancy                             Anesthesia Physical Anesthesia Plan  ASA: 2  Anesthesia Plan: Epidural   Post-op Pain Management:    Induction:   PONV Risk Score and Plan:   Airway Management Planned:   Additional Equipment:   Intra-op Plan:   Post-operative Plan:   Informed Consent: I have reviewed the patients History and Physical, chart, labs and discussed the procedure including the risks, benefits and alternatives for the proposed anesthesia with the patient or authorized representative who has indicated his/her understanding and acceptance.       Plan Discussed with:   Anesthesia Plan Comments:        Anesthesia Quick Evaluation

## 2022-09-19 NOTE — Progress Notes (Signed)
Patient ID: Kelsey Campbell, female   DOB: 05/05/1986, 37 y.o.   MRN: [id number]  Called back to room about after she got in the tub, has strong urge to push. Checked her in hands/knees position - could only feel fetal head at +1 to +2 station. Fetal heart tones 150s with variability while in tub. Meconium remains light to scant. Encouraged mom to push lightly. She did so for and expressed frustration at lack of progress, so I checked her again in more of a lithotomy (in the tub) and noted an anterior lip. Instructed her to try hands and knees without directed pushing until the lip reduces. Pt amenable, doula and FOB continue to be at bedside and very engaged in patient care.  Edd Arbour, CNM, MSN, IBCLC Certified Nurse Midwife, [facility]

## 2022-09-19 NOTE — Discharge Summary (Signed)
Postpartum Discharge Summary    Patient Name: Kelsey Campbell DOB: February 22, 1986 MRN: [id number]  Date of admission: 09/18/2022 Delivery date:09/19/2022  Delivering provider: Edd Arbour R  Date of discharge: 09/20/2022  Admitting diagnosis: Encounter for induction of labor [Z34.90] Intrauterine pregnancy: [redacted]w[redacted]d     Secondary diagnosis:  Principal Problem:   Vaginal delivery Active Problems:   Encounter for induction of labor  Additional problems: n/a    Discharge diagnosis: Term Pregnancy Delivered                                              Post partum procedures: n/a Augmentation: AROM, Pitocin, Cytotec, and IP Foley Complications: None  Hospital course: Induction of Labor With Vaginal Delivery   37 y.o. yo [id number] at [redacted]w[redacted]d was admitted to the hospital 09/18/2022 for induction of labor.  Indication for induction: Postdates.  Patient had an labor course complicated by nothing. Membrane Rupture Time/Date: 9:27 PM ,09/18/2022   Delivery Method:Vaginal, Spontaneous  Episiotomy: None  Lacerations:  2nd degree  Details of delivery can be found in separate delivery note.  Patient had a postpartum course complicated by none. Patient is discharged home 09/20/22.  Newborn Data: Birth date:09/19/2022  Birth time:7:38 AM  Gender:Female  Living status:Living  Apgars:8 ,9  Weight:3280 g   Magnesium Sulfate received: No BMZ received: No Rhophylac:N/A MMR:N/A T-DaP:Given prenatally Flu: No Transfusion:No  Physical exam  Vitals:   09/19/22 1515 09/19/22 2002 09/19/22 2330 09/20/22 0542  BP: 120/72 112/66 118/77 106/64  Pulse: 69 72 68 67  Resp: 18 18 18 18   Temp: 98.5 F (36.9 C) 98.6 F (37 C) 97.7 F (36.5 C) 98.2 F (36.8 C)  TempSrc: Oral Oral Oral Oral  SpO2: 100%     Weight:      Height:       General: alert, cooperative, and no distress Lochia: appropriate Uterine Fundus: firm Incision: N/A DVT Evaluation: No evidence of DVT seen on physical exam. Labs: Lab  Results  Component Value Date   WBC 12.4 (H) 09/20/2022   HGB 9.8 (L) 09/20/2022   HCT 28.0 (L) 09/20/2022   MCV 96.9 09/20/2022   PLT 178 09/20/2022      Latest Ref Rng & Units 05/03/2019    5:20 PM  CMP  Glucose 70 - 99 mg/dL 244   BUN 6 - 20 mg/dL 16   Creatinine 0.10 - 1.00 mg/dL 2.72   Sodium 536 - 644 mmol/L 138   Potassium 3.5 - 5.1 mmol/L 3.9   Chloride 98 - 111 mmol/L 104   CO2 22 - 32 mmol/L 25   Calcium 8.9 - 10.3 mg/dL 9.4    Edinburgh Score:    09/19/2022    9:47 AM  Edinburgh Postnatal Depression Scale Screening Tool  I have been able to laugh and see the funny side of things. 0  I have looked forward with enjoyment to things. 0  I have blamed myself unnecessarily when things went wrong. 0  I have been anxious or worried for no good reason. 0  I have felt scared or panicky for no good reason. 0  Things have been getting on top of me. 0  I have been so unhappy that I have had difficulty sleeping. 0  I have felt sad or miserable. 0  I have been so unhappy that I have been crying. 0  The  thought of harming myself has occurred to me. 0  Edinburgh Postnatal Depression Scale Total 0     After visit meds:  Allergies as of 09/20/2022       Reactions   Sulfamethoxazole-trimethoprim Itching, Rash        Medication List     STOP taking these medications    aspirin EC 81 MG tablet   b complex vitamins capsule   multivitamin-prenatal 27-0.8 MG Tabs tablet   omeprazole 40 MG capsule Commonly known as: PRILOSEC   polyethylene glycol powder 17 GM/SCOOP powder Commonly known as: GLYCOLAX/MIRALAX       TAKE these medications    acetaminophen 325 MG tablet Commonly known as: Tylenol Take 2 tablets (650 mg total) by mouth every 4 (four) hours as needed (for pain scale < 4).   benzocaine-Menthol 20-0.5 % Aero Commonly known as: DERMOPLAST Apply 1 Application topically as needed for irritation (perineal discomfort).   dibucaine 1 % Oint Commonly  known as: NUPERCAINAL Place 1 Application rectally as needed for hemorrhoids.   ibuprofen 600 MG tablet Commonly known as: ADVIL Take 1 tablet (600 mg total) by mouth every 6 (six) hours.   senna-docusate 8.6-50 MG tablet Commonly known as: Senokot-S Take 2 tablets by mouth daily.   witch hazel-glycerin pad Commonly known as: TUCKS Apply 1 Application topically as needed for hemorrhoids.         Discharge home in stable condition Infant Feeding: Bottle and Breast Infant Disposition:home with mother Discharge instruction: per After Visit Summary and Postpartum booklet. Activity: Advance as tolerated. Pelvic rest for 6 weeks.  Diet: routine diet  Future Appointments: Future Appointments  Date Time Provider Department Center  10/21/2022  3:35 PM Bernerd Limbo, CNM [facility]   Follow up Visit: Message not sent, appt already scheduled Please schedule this patient for a In person postpartum visit in 4 weeks with the following provider:  Edd Arbour, CNM . Additional Postpartum F/U: None   Low risk pregnancy complicated by:  None Delivery mode:  Vaginal, Spontaneous  Anticipated Birth Control:   Vasectomy (scheduled the week after delivery)   09/20/2022 Myrtie Hawk, DO

## 2022-09-20 LAB — CBC
HCT: 28 % — ABNORMAL LOW (ref 36.0–46.0)
Hemoglobin: 9.8 g/dL — ABNORMAL LOW (ref 12.0–15.0)
MCH: 33.9 pg (ref 26.0–34.0)
MCHC: 35 g/dL (ref 30.0–36.0)
MCV: 96.9 fL (ref 80.0–100.0)
Platelets: 178 10*3/uL (ref 150–400)
RBC: 2.89 MIL/uL — ABNORMAL LOW (ref 3.87–5.11)
RDW: 13 % (ref 11.5–15.5)
WBC: 12.4 10*3/uL — ABNORMAL HIGH (ref 4.0–10.5)
nRBC: 0 % (ref 0.0–0.2)

## 2022-09-20 MED ORDER — IBUPROFEN 600 MG PO TABS: 600.0000 mg | ORAL_TABLET | Freq: Four times a day (QID) | ORAL | 0 refills | Status: DC

## 2022-09-20 MED ORDER — ACETAMINOPHEN 325 MG PO TABS: 650.0000 mg | ORAL_TABLET | ORAL | 0 refills | Status: DC | PRN

## 2022-09-20 MED ORDER — BENZOCAINE-MENTHOL 20-0.5 % EX AERO: 1.0000 | INHALATION_SPRAY | CUTANEOUS | 0 refills | Status: DC | PRN

## 2022-09-20 MED ORDER — WITCH HAZEL-GLYCERIN EX PADS: 1.0000 | MEDICATED_PAD | CUTANEOUS | 12 refills | Status: DC | PRN

## 2022-09-20 MED ORDER — SENNOSIDES-DOCUSATE SODIUM 8.6-50 MG PO TABS: 2.0000 | ORAL_TABLET | Freq: Every day | ORAL | 0 refills | Status: DC

## 2022-09-20 MED ORDER — DIBUCAINE (PERIANAL) 1 % EX OINT: 1.0000 | TOPICAL_OINTMENT | CUTANEOUS | 0 refills | Status: DC | PRN

## 2022-09-20 NOTE — Lactation Note (Signed)
This note was copied from a baby's chart. Lactation Consultation Note  Patient Name: Kelsey Campbell [id number] Date: 09/20/2022 Age:39 hours Reason for consult: Follow-up assessment;Term;Breastfeeding assistance  LC entered the room and the supporting parent was holding the infant.  Per the birth parent things are going well with breastfeeding.  She stated that she gave the infant some formula last night because he was off and on the breast a lot.  The birth parent commented that her nipples were a little sore.  LC gave the birth parent coconut oil per her request.  The birth parent said that she felt some pinching when the infant is latching.  The birth parent was made aware that she should feel tugging and not pinching.  The birth parent will call for lactation assistance at the next feeding.    Maternal Data    Feeding Mother's Current Feeding Choice: Breast Milk and Formula Nipple Type: Slow - flow  LATCH Score                    Lactation Tools Discussed/Used    Interventions Interventions: Education  Discharge    Consult Status Consult Status: Follow-up Date: 09/21/22 Follow-up type: In-patient    Delene Loll 09/20/2022, 12:15 PM

## 2022-09-20 NOTE — Progress Notes (Signed)
   PRENATAL VISIT NOTE  Subjective:  Kelsey Campbell is a 37 y.o. [phone] at [redacted]w[redacted]d being seen today for ongoing prenatal care.  She is currently monitored for the following issues for this low-risk pregnancy and has History of reversal of tubal ligation; History of ectopic pregnancy; Supervision of low-risk pregnancy; Advanced maternal age in multigravida; and Encounter for induction of labor on their problem list.  Patient reports occasional contractions.  Contractions: Irregular. Vag. Bleeding: None.  Movement: Present. Denies leaking of fluid.   The following portions of the patient's history were reviewed and updated as appropriate: allergies, current medications, past family history, past medical history, past social history, past surgical history and problem list.   Objective:   Vitals:   09/16/22 1633  BP: 111/70  Pulse: 91  Weight: 204 lb (92.5 kg)    Fetal Status: Fetal Heart Rate (bpm): NST   Movement: Present  Presentation: Vertex  General:  Alert, oriented and cooperative. Patient is in no acute distress.  Skin: Skin is warm and dry. No rash noted.   Cardiovascular: Normal heart rate noted  Respiratory: Normal respiratory effort, no problems with respiration noted  Abdomen: Soft, gravid, appropriate for gestational age.  Pain/Pressure: Present     Pelvic: Cervical exam performed in the presence of a chaperone Dilation: 1 Effacement (%): 50 Station: -3  Extremities: Normal range of motion.  Edema: Moderate pitting, indentation subsides rapidly  Mental Status: Normal mood and affect. Normal behavior. Normal judgment and thought content.   Assessment and Plan:  Pregnancy: [id number] at [redacted]w[redacted]d 1. Encounter for supervision of low-risk pregnancy in third trimester - Doing well, feeling regular and vigorous fetal movement   2. [redacted] weeks gestation of pregnancy - Routine OB care   3. Post-term pregnancy, 40-42 weeks of gestation - NST reactive today, discussed management of  post-dates pregnancy, pt opted for elective IOL list on Friday with scheduled date on Monday.  Term labor symptoms and general obstetric precautions including but not limited to vaginal bleeding, contractions, leaking of fluid and fetal movement were reviewed in detail with the patient. Please refer to After Visit Summary for other counseling recommendations.   Return in about 5 weeks (around 10/21/2022) for PP.  Future Appointments  Date Time Provider Department Center  10/21/2022  3:35 PM Bernerd Limbo, CNM [facility]    Bernerd Limbo, CNM

## 2022-09-20 NOTE — Progress Notes (Signed)
Briefly spoke with mom about urination urgency. She has had 2 episodes with leaking urine with standing and while holding baby. She admits to not emptying her bladder as often as she should and endorses the sensation to urinate. She is having no issue toileting or ambulating. Discussed time to allow pelvic floor muscles to recuperate. If continues can send to pelvic flood PT. She voiced understanding.   Lavonda Jumbo, DO OB Fellow, Faculty [facility], Center for [facility] 09/20/2022, 4:25 PM

## 2022-09-20 NOTE — Lactation Note (Signed)
This note was copied from a baby's chart. Lactation Consultation Note  Patient Name: Kelsey Campbell [id number] Date: 09/20/2022 Age:39 hours Reason for consult: Follow-up assessment;Mother's request;Term;Breastfeeding assistance  LC was called to the patient room by the birth parent.  The birth parent was preparing to latch the infant when [facility] entered the room.  The infant latched to the right breast.  LC asked the birth parent if she felt pinching with the latch.  LC encouraged the birth parent to pull down on the infant's chin and flip his upper lip.  The birth parent stated that the latch felt much better.  The infant was latched with his lips flanged, tongue down, sucking was rhythmic, and some jaw extensions were noted.  The infant fed for 6 min while the LC was in the room and was still feeding when the LC left.  All questions were answered.     Maternal Data    Feeding Mother's Current Feeding Choice: Breast Milk and Formula Nipple Type: Slow - flow  LATCH Score Latch: Grasps breast easily, tongue down, lips flanged, rhythmical sucking.  Audible Swallowing: A few with stimulation  Type of Nipple: Everted at rest and after stimulation  Comfort (Breast/Nipple): Soft / non-tender  Hold (Positioning): No assistance needed to correctly position infant at breast.  LATCH Score: 9   Lactation Tools Discussed/Used Tools: Coconut oil  Interventions Interventions: Assisted with latch;Education  Discharge    Consult Status Consult Status: Follow-up Date: 09/21/22 Follow-up type: In-patient    Delene Loll 09/20/2022, 12:31 PM

## 2022-09-20 NOTE — Progress Notes (Signed)
Circumcision Consent   -Circumcision procedure details discussed including usage of local anesthesia, infant soother, and removal and disposal of foreskin. -Risks and benefits of procedure were reviewed including, but not limited to:  *Benefits include reduction in the rates of urinary tract infection (UTI), penile cancer, some sexually transmitted infections, penile inflammatory, and retractile disorders, as well as easier hygiene.   *Risks include bleeding, infection, injury of glans which may lead to need for additional surgery, penile deformity, or urinary tract issues, unsatisfactory cosmetic appearance and other potential complications related to the procedure.   -Informed that procedure will not be performed if provider deems inappropriate d/t penile size, noted deformity, or unsatisfactory pediatric evaluation. -It was emphasized that this is an elective procedure.   -Post circumcision care discussed.   Patient wants to proceed with circumcision. Informed that team would be notified and complete prior to discharge and upon satisfactory pediatric evaluation of infant.    Myrtie Hawk, DO FMOB Fellow, Faculty practice [facility], Center for [facility] 8:26 AM

## 2022-09-21 ENCOUNTER — Inpatient Hospital Stay (HOSPITAL_COMMUNITY): Payer: BC Managed Care – PPO

## 2022-09-21 ENCOUNTER — Encounter: Payer: Self-pay | Admitting: General Practice

## 2022-09-29 ENCOUNTER — Telehealth (HOSPITAL_COMMUNITY): Payer: Self-pay | Admitting: *Deleted

## 2022-09-29 NOTE — Telephone Encounter (Signed)
Left phone voicemail message.  Duffy Rhody, RN 09-29-2022 at 1:55pm

## 2022-10-20 NOTE — Progress Notes (Signed)
Post Partum Visit Note  Kelsey Campbell is a 37 y.o. [phone] female who presents for a postpartum visit. She is 4 weeks 3 days postpartum following a normal spontaneous vaginal delivery.  I have fully reviewed the prenatal and intrapartum course. The delivery was at [redacted]w[redacted]d.  Anesthesia: epidural. Postpartum course has been uncomplicated. Baby is doing well. Baby is feeding by both breast and bottle - Enfamil Neuropro . Bleeding no bleeding. Bowel function is normal. Bladder function is abnormal: having trouble holding her urine when full or near a restroom . Patient is not sexually active. Contraception method is vasectomy. Postpartum depression screening: negative.   The pregnancy intention screening data noted above was reviewed. Potential methods of contraception were discussed. The patient elected to proceed with No data recorded.   Edinburgh Postnatal Depression Scale - 10/21/22 1751       Edinburgh Postnatal Depression Scale:  In the Past 7 Days   I have been able to laugh and see the funny side of things. 0    I have looked forward with enjoyment to things. 0    I have blamed myself unnecessarily when things went wrong. 1    I have been anxious or worried for no good reason. 0    I have felt scared or panicky for no good reason. 0    Things have been getting on top of me. 0    I have been so unhappy that I have had difficulty sleeping. 0    I have felt sad or miserable. 0    I have been so unhappy that I have been crying. 0    The thought of harming myself has occurred to me. 0    Edinburgh Postnatal Depression Scale Total 1             Health Maintenance Due  Topic Date Due   COVID-19 Vaccine (4 - 2023-24 season) 01/30/2022    The following portions of the patient's history were reviewed and updated as appropriate: allergies, current medications, past family history, past medical history, past social history, past surgical history, and problem list.  Review of  Systems Pertinent items noted in HPI and remainder of comprehensive ROS otherwise negative.  Objective:  BP 109/77   Pulse 80   Ht 5' 3.5" (1.613 m)   Wt 188 lb 11.2 oz (85.6 kg)   LMP 12/02/2021 (Approximate)   Breastfeeding Yes   BMI 32.90 kg/m    Constitutional: Alert, oriented female in no physical distress.  HEENT: PERRLA Skin: normal color and turgor, no rash Cardiovascular: normal rate & rhythm, warm and well perfused Respiratory: normal effort, no problems with respiration noted GI: Abd soft, non-tender, non-distended MS: Extremities nontender, no edema, normal ROM Neurologic: Alert and oriented x 4.  GU: no CVA tenderness Pelvic: exam deferred Breasts: normal lactating breasts  Assessment:  1. Postpartum care and examination of lactating mother - Doing very well, had an enjoyable birthing experience  2. Mixed urge and stress incontinence - Ambulatory referral to Physical Therapy   Plan:   Essential components of care per ACOG recommendations:  1.  Mood and well being: Patient with negative depression screening today. Reviewed local resources for support.  - Patient tobacco use? No.   - hx of drug use? No.    2. Infant care and feeding:  -Patient currently breastmilk feeding? Yes. Reviewed importance of draining breast regularly to support lactation.  -Social determinants of health (SDOH) reviewed in EPIC. No concerns  3. Sexuality, contraception and birth spacing - Patient does not want a pregnancy in the next year.  Desired family size is 3 children.  - Reviewed reproductive life planning. Reviewed contraceptive methods based on pt preferences and effectiveness.  Patient's husband is recovering from his Vasectomy. - Discussed birth spacing of 18 months  4. Sleep and fatigue -Encouraged family/partner/community support of 4 hrs of uninterrupted sleep to help with mood and fatigue  5. Physical Recovery  - Discussed patients delivery and complications. She  describes her labor as good. - Patient had a Vaginal, no problems at delivery. Patient had a 2nd degree laceration. Perineal healing reviewed. Patient expressed understanding - Patient has urinary incontinence? Yes. Discussed role of pelvic floor PT. Offered PT and patient accepted. Patient was referred to pelvic floor PT.  - Patient is safe to resume physical and sexual activity  6.  Health Maintenance - HM due items addressed Yes - Last pap smear  Diagnosis  Date Value Ref Range Status  02/25/2022   Final   - Negative for intraepithelial lesion or malignancy (NILM)   Pap smear not done at today's visit.  -Breast Cancer screening indicated? No.   7. Chronic Disease/Pregnancy Condition follow up: None - PCP follow up as needed  Bernerd Limbo, CNM Center for [employer], [facility]

## 2022-10-21 ENCOUNTER — Ambulatory Visit (INDEPENDENT_AMBULATORY_CARE_PROVIDER_SITE_OTHER): Payer: BC Managed Care – PPO | Admitting: Certified Nurse Midwife

## 2022-10-21 ENCOUNTER — Other Ambulatory Visit: Payer: Self-pay

## 2022-10-21 DIAGNOSIS — N3946 Mixed incontinence: Secondary | ICD-10-CM | POA: Diagnosis not present

## 2022-10-21 NOTE — Patient Instructions (Signed)
Midwife Jam's Lactation Bars Preheat oven to 350 degrees and grease a 9x13 pan Combine your fats and sugars: - 1c salted butter - 1/2-3/4c peanut butter - 2 eggs - 2tbsp flaxmeal in 4tbsp water - 1tsp vanilla - 1c white sugar - 1c brown sugar Mix the following dry ingredients in a separate bowl: - 2c flour - 3 tbsp Brewer's yeast - 1tsp baking soda - 1tsp salt Slowly add the dry into the wet mix. Once well combined, stir in the following: - 3c rolled oats  - 1/4c hemp hearts - 1c (or more) chocolate chips  Bake for .  Can sub almond butter and add 1tsp cinnamon and raisins or dried cranberries.   Can also try LegendairyMilk supplements

## 2022-10-26 ENCOUNTER — Encounter: Payer: Self-pay | Admitting: Certified Nurse Midwife

## 2022-10-30 ENCOUNTER — Other Ambulatory Visit: Payer: Self-pay

## 2022-10-30 ENCOUNTER — Ambulatory Visit: Payer: BC Managed Care – PPO | Attending: Certified Nurse Midwife | Admitting: Physical Therapy

## 2022-10-30 ENCOUNTER — Encounter: Payer: Self-pay | Admitting: Physical Therapy

## 2022-10-30 DIAGNOSIS — M6281 Muscle weakness (generalized): Secondary | ICD-10-CM | POA: Insufficient documentation

## 2022-10-30 DIAGNOSIS — R279 Unspecified lack of coordination: Secondary | ICD-10-CM | POA: Insufficient documentation

## 2022-10-30 DIAGNOSIS — N3946 Mixed incontinence: Secondary | ICD-10-CM | POA: Insufficient documentation
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
## 2022-11-02 ENCOUNTER — Encounter: Payer: Self-pay | Admitting: Certified Nurse Midwife

## 2022-11-02 DIAGNOSIS — Z3009 Encounter for other general counseling and advice on contraception: Secondary | ICD-10-CM

## 2022-11-03 ENCOUNTER — Other Ambulatory Visit: Payer: Self-pay | Admitting: Certified Nurse Midwife

## 2022-11-03 DIAGNOSIS — Z30011 Encounter for initial prescription of contraceptive pills: Secondary | ICD-10-CM

## 2022-11-03 MED ORDER — SLYND 4 MG PO TABS
1.0000 | ORAL_TABLET | Freq: Every day | ORAL | 11 refills | Status: DC
Start: 2022-11-03 — End: 2022-12-01

## 2022-11-03 NOTE — Progress Notes (Signed)
Discussed BC at [facility] appointment, pt called back and wants temporary BC while husband heals from vasectomy.  Edd Arbour, CNM, MSN, IBCLC Certified Nurse Midwife, [facility]

## 2022-11-09 ENCOUNTER — Encounter: Payer: Self-pay | Admitting: Physical Therapy

## 2022-11-09 ENCOUNTER — Ambulatory Visit: Payer: BC Managed Care – PPO | Attending: Certified Nurse Midwife | Admitting: Physical Therapy

## 2022-11-09 DIAGNOSIS — M1811 Unilateral primary osteoarthritis of first carpometacarpal joint, right hand: Secondary | ICD-10-CM | POA: Diagnosis not present

## 2022-11-09 DIAGNOSIS — M6281 Muscle weakness (generalized): Secondary | ICD-10-CM | POA: Diagnosis not present

## 2022-11-09 DIAGNOSIS — Z130 Encounter for screening for diseases of the blood and blood-forming organs and certain disorders involving the immune mechanism: Secondary | ICD-10-CM | POA: Diagnosis not present

## 2022-11-09 DIAGNOSIS — R279 Unspecified lack of coordination: Secondary | ICD-10-CM | POA: Diagnosis not present

## 2022-11-09 DIAGNOSIS — Z0000 Encounter for general adult medical examination without abnormal findings: Secondary | ICD-10-CM | POA: Diagnosis not present

## 2022-11-09 DIAGNOSIS — Z13220 Encounter for screening for lipoid disorders: Secondary | ICD-10-CM | POA: Diagnosis not present

## 2022-11-09 DIAGNOSIS — R200 Anesthesia of skin: Secondary | ICD-10-CM | POA: Diagnosis not present

## 2022-11-09 DIAGNOSIS — Z1331 Encounter for screening for depression: Secondary | ICD-10-CM | POA: Diagnosis not present

## 2022-11-09 DIAGNOSIS — R202 Paresthesia of skin: Secondary | ICD-10-CM | POA: Diagnosis not present
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
## 2022-11-17 DIAGNOSIS — Z130 Encounter for screening for diseases of the blood and blood-forming organs and certain disorders involving the immune mechanism: Secondary | ICD-10-CM | POA: Diagnosis not present

## 2022-11-17 DIAGNOSIS — Z13220 Encounter for screening for lipoid disorders: Secondary | ICD-10-CM | POA: Diagnosis not present

## 2022-11-18 ENCOUNTER — Encounter: Payer: Self-pay | Admitting: Physical Therapy

## 2022-11-26 ENCOUNTER — Ambulatory Visit: Payer: BC Managed Care – PPO | Admitting: Physical Therapy

## 2022-12-01 DIAGNOSIS — Z6832 Body mass index (BMI) 32.0-32.9, adult: Secondary | ICD-10-CM | POA: Diagnosis not present

## 2022-12-01 DIAGNOSIS — Z713 Dietary counseling and surveillance: Secondary | ICD-10-CM | POA: Diagnosis not present

## 2022-12-01 MED ORDER — SLYND 4 MG PO TABS
1.0000 | ORAL_TABLET | Freq: Every day | ORAL | 11 refills | Status: DC
Start: 2022-12-01 — End: 2023-12-09

## 2022-12-04 ENCOUNTER — Other Ambulatory Visit: Payer: Self-pay

## 2022-12-04 ENCOUNTER — Ambulatory Visit: Payer: BC Managed Care – PPO

## 2022-12-04 ENCOUNTER — Other Ambulatory Visit (HOSPITAL_COMMUNITY)
Admission: RE | Admit: 2022-12-04 | Discharge: 2022-12-04 | Disposition: A | Discharge status: Home / Self Care | Payer: BC Managed Care – PPO | Source: Ambulatory Visit | Attending: Family Medicine | Admitting: Family Medicine

## 2022-12-04 VITALS — BP 108/69 | HR 76 | Ht 63.0 in | Wt 186.6 lb

## 2022-12-04 DIAGNOSIS — N930 Postcoital and contact bleeding: Secondary | ICD-10-CM | POA: Diagnosis not present

## 2022-12-04 NOTE — Progress Notes (Signed)
Pt reports having bleeding after intercourse and feeling uncomfortable in vaginal area.  Pt states that she was requested by Edd Arbour, CNM to have a self swab to test for infection.  Pt explained how to obtain self swab and that we will call with abnormal results.   Pt verbalized understanding.   Addison Naegeli, RN  12/04/22

## 2022-12-07 LAB — CERVICOVAGINAL ANCILLARY ONLY
Bacterial Vaginitis (gardnerella): NEGATIVE
Candida Glabrata: NEGATIVE
Candida Vaginitis: NEGATIVE
Chlamydia: NEGATIVE
Comment: NEGATIVE
Comment: NEGATIVE
Comment: NEGATIVE
Comment: NEGATIVE
Comment: NEGATIVE
Comment: NORMAL
Neisseria Gonorrhea: NEGATIVE
Trichomonas: NEGATIVE

## 2022-12-08 NOTE — Progress Notes (Signed)
History:  Kelsey Campbell is a 37 y.o. [phone] who presents to clinic today for dyspareunia and post-coital bleeding at around 33mo postpartum. Notes that there is still some tenderness on initial penetration and then feels scratchy/irritated afterward and has some spotting for 24-48hrs ranging from pink to dark brown.   Has been to PFPT for leakage of urine and found it to be very helpful, has stopped going because the exercises given by the physical therapist and her trainer are working well.  The following portions of the patient's history were reviewed and updated as appropriate: allergies, current medications, family history, past medical history, social history, past surgical history and problem list.  Review of Systems:  Pertinent items noted in HPI and remainder of comprehensive ROS otherwise negative.   Objective:  Physical Exam BP 108/78   Pulse 74   Wt 184 lb 14.4 oz (83.9 kg)   Breastfeeding No   BMI 32.75 kg/m  Physical Exam Vitals and nursing note reviewed.  Constitutional:      Appearance: Normal appearance.  Eyes:     Pupils: Pupils are equal, round, and reactive to light.  Cardiovascular:     Rate and Rhythm: Normal rate and regular rhythm.  Pulmonary:     Effort: Pulmonary effort is normal.  Abdominal:     Palpations: Abdomen is soft.  Genitourinary:    General: Normal vulva.       Comments: Areas of mild tenderness on palpation, but no signs of tears/infection. Vulva and introitus normal with pink ruggae, physiologic discharge and no bleeding noted. Musculoskeletal:        General: Normal range of motion.  Skin:    General: Skin is warm and dry.     Capillary Refill: Capillary refill takes less than 2 seconds.  Neurological:     Mental Status: She is alert and oriented to person, place, and time.  Psychiatric:        Mood and Affect: Mood normal.        Behavior: Behavior normal.    Labs and Imaging None collected today, recent wet prep  normal.  Assessment & Plan:  1. Female dyspareunia - Pain is less during sex and more shortly after. Using coconut oil for lubricant, advised this can be drying to vaginal tissues and encouraged her to use lube designed for sex. - If tenderness remains, can send back to pelvic floor physical therapy.  2. PCB (post coital bleeding) - No structural or infectious reason noted, encouraged to switch lubricant and see if that helps. - Has also had some irregularity to her cycle since starting POPs. Got her cycle 2wks after starting, suspect some of the spotting is related to cycle irregularities. Encouraged to see how the next month goes as her cycle adjusts to POPs and she uses a different lubricant.  Follow up PRN or for annual exam.  Bernerd Limbo, CNM 12/09/22 9:47 AM

## 2022-12-09 ENCOUNTER — Encounter: Payer: Self-pay | Admitting: Certified Nurse Midwife

## 2022-12-09 ENCOUNTER — Other Ambulatory Visit: Payer: Self-pay

## 2022-12-09 ENCOUNTER — Ambulatory Visit (INDEPENDENT_AMBULATORY_CARE_PROVIDER_SITE_OTHER): Payer: BC Managed Care – PPO | Admitting: Certified Nurse Midwife

## 2022-12-09 VITALS — BP 108/78 | HR 74 | Wt 184.9 lb

## 2022-12-09 DIAGNOSIS — N941 Unspecified dyspareunia: Secondary | ICD-10-CM | POA: Diagnosis not present

## 2022-12-09 DIAGNOSIS — N930 Postcoital and contact bleeding: Secondary | ICD-10-CM | POA: Diagnosis not present

## 2022-12-10 ENCOUNTER — Ambulatory Visit: Payer: BC Managed Care – PPO | Admitting: Physical Therapy

## 2022-12-14 ENCOUNTER — Ambulatory Visit: Payer: BC Managed Care – PPO | Admitting: Physical Therapy

## 2022-12-22 ENCOUNTER — Encounter: Payer: BC Managed Care – PPO | Admitting: Physical Therapy

## 2022-12-25 DIAGNOSIS — Z6832 Body mass index (BMI) 32.0-32.9, adult: Secondary | ICD-10-CM | POA: Diagnosis not present

## 2022-12-25 DIAGNOSIS — Z713 Dietary counseling and surveillance: Secondary | ICD-10-CM | POA: Diagnosis not present

## 2022-12-28 ENCOUNTER — Encounter: Payer: BC Managed Care – PPO | Admitting: Physical Therapy

## 2023-02-19 DIAGNOSIS — Z713 Dietary counseling and surveillance: Secondary | ICD-10-CM | POA: Diagnosis not present

## 2023-02-19 DIAGNOSIS — Z6832 Body mass index (BMI) 32.0-32.9, adult: Secondary | ICD-10-CM | POA: Diagnosis not present

## 2023-03-19 DIAGNOSIS — Z8632 Personal history of gestational diabetes: Secondary | ICD-10-CM | POA: Diagnosis not present

## 2023-03-19 DIAGNOSIS — Z713 Dietary counseling and surveillance: Secondary | ICD-10-CM | POA: Diagnosis not present

## 2023-05-17 ENCOUNTER — Encounter: Payer: Self-pay | Admitting: Certified Nurse Midwife

## 2023-05-18 ENCOUNTER — Ambulatory Visit: Payer: BC Managed Care – PPO

## 2023-05-18 ENCOUNTER — Other Ambulatory Visit: Payer: Self-pay

## 2023-05-18 ENCOUNTER — Other Ambulatory Visit (HOSPITAL_COMMUNITY)
Admission: RE | Admit: 2023-05-18 | Discharge: 2023-05-18 | Disposition: A | Discharge status: Home / Self Care | Payer: BC Managed Care – PPO | Source: Ambulatory Visit | Attending: Family Medicine | Admitting: Family Medicine

## 2023-05-18 VITALS — BP 102/62 | HR 76 | Ht 63.0 in | Wt 176.7 lb

## 2023-05-18 DIAGNOSIS — R350 Frequency of micturition: Secondary | ICD-10-CM | POA: Diagnosis not present

## 2023-05-18 DIAGNOSIS — N898 Other specified noninflammatory disorders of vagina: Secondary | ICD-10-CM | POA: Diagnosis not present

## 2023-05-18 LAB — POCT URINALYSIS DIP (DEVICE)
Bilirubin Urine: NEGATIVE
Glucose, UA: NEGATIVE mg/dL
Hgb urine dipstick: NEGATIVE
Ketones, ur: NEGATIVE mg/dL
Nitrite: NEGATIVE
Protein, ur: NEGATIVE mg/dL
Specific Gravity, Urine: 1.02 (ref 1.005–1.030)
Urobilinogen, UA: 0.2 mg/dL (ref 0.0–1.0)
pH: 5.5 (ref 5.0–8.0)

## 2023-05-18 NOTE — Progress Notes (Signed)
Here for urine for possible UTI. Reports urinary frequency, feeling uncomfortable in vaginal area and when urinates but not pain or burnng. Feels like she is not emptying. Has had UTi a few times in past. UA today negative except trace leukocytes. Sent for culture . Offered for patient to do self swab also . She reports she has had yeast in the past. She would like to do self swab. Advised if culture and self swab negative would advised schedule provider visit for evaluation. Advised if any test positive she will be contacted with result and treatment. She voices understanding. Nancy Fetter

## 2023-05-19 LAB — CERVICOVAGINAL ANCILLARY ONLY
Bacterial Vaginitis (gardnerella): NEGATIVE
Candida Glabrata: NEGATIVE
Candida Vaginitis: NEGATIVE
Comment: NEGATIVE
Comment: NEGATIVE
Comment: NEGATIVE

## 2023-05-21 LAB — URINE CULTURE

## 2023-05-24 ENCOUNTER — Encounter: Payer: Self-pay | Admitting: Certified Nurse Midwife

## 2023-05-24 ENCOUNTER — Other Ambulatory Visit: Payer: Self-pay | Admitting: Family Medicine

## 2023-05-24 MED ORDER — NITROFURANTOIN MONOHYD MACRO 100 MG PO CAPS
100.0000 mg | ORAL_CAPSULE | Freq: Two times a day (BID) | ORAL | 0 refills | Status: DC
Start: 2023-05-24 — End: 2023-06-26

## 2023-06-26 ENCOUNTER — Ambulatory Visit
Admission: EM | Admit: 2023-06-26 | Discharge: 2023-06-26 | Disposition: A | Discharge status: Home / Self Care | Payer: BC Managed Care – PPO | Attending: Family Medicine | Admitting: Family Medicine

## 2023-06-26 ENCOUNTER — Other Ambulatory Visit: Payer: Self-pay

## 2023-06-26 DIAGNOSIS — R300 Dysuria: Secondary | ICD-10-CM | POA: Diagnosis not present

## 2023-06-26 DIAGNOSIS — B349 Viral infection, unspecified: Secondary | ICD-10-CM | POA: Diagnosis not present

## 2023-06-26 LAB — POC COVID19/FLU A&B COMBO
Covid Antigen, POC: NEGATIVE
Influenza A Antigen, POC: NEGATIVE
Influenza B Antigen, POC: NEGATIVE

## 2023-06-26 LAB — POCT URINALYSIS DIP (MANUAL ENTRY)
Bilirubin, UA: NEGATIVE
Blood, UA: NEGATIVE
Glucose, UA: NEGATIVE mg/dL
Ketones, POC UA: NEGATIVE mg/dL
Leukocytes, UA: NEGATIVE
Nitrite, UA: NEGATIVE
Protein Ur, POC: NEGATIVE mg/dL
Spec Grav, UA: 1.015 (ref 1.010–1.025)
Urobilinogen, UA: 0.2 U/dL
pH, UA: 7.5 (ref 5.0–8.0)

## 2023-06-26 LAB — POCT URINE PREGNANCY: Preg Test, Ur: NEGATIVE

## 2023-06-26 MED ORDER — CEPHALEXIN 500 MG PO CAPS: 500.0000 mg | ORAL_CAPSULE | Freq: Two times a day (BID) | ORAL | 0 refills | Status: AC

## 2023-06-26 NOTE — Discharge Instructions (Signed)
Follow-up with your doctor your urinary symptoms fail to improve or you develop new symptoms such as fever, abdominal pain, vomiting.  Supportive Care Medications  These are medications that may help with your symptoms. However, Please note that if your PCP has told you not to use certain products please follow his/her recommendations.  For example: - if you have high BP you should use something similar to Coricidin HPB, not Sudafed or antihistamines with a "D" such as Allegra D. The "D" means decongestant.   -Higher Doses of NSAIDS (Ibuprofen, Aleve etc) with Kidney disease or poorly controlled BP.  Fever, Body aches, Headache  Ibuprofen 200 mg 2 tablets and Acetaminophen 2 tabs 4 times a day just before meals and at bedtime (every 6 hours)    Do not use NSAIDS if you are allergic to NSAIDS, if you have been given oral steroids-prednisone, methylprednisolone, dexamethasone until your steroids are finished, if you are pregnant or breast feeding, or if you have history of kidney or liver disease.   Sore Throat: Cepacol throat lozenges or spray  Cough Drops Warm salt water gargles  How to make saltwater rinses Use warm water, because warmth is more relieving to a sore throat than cold water. Warm water will also help the salt dissolve into the water more effectively. Use any type of salt you have available. Most saltwater rinse recipes call for 8 ounces of warm water and 1 teaspoon of salt. However, if your mouth is tender and the saltwater rinse stings, decrease the salt to a 1/2 teaspoon for the first 1 to 2 days. Bring water to a boil, then remove from heat, add salt, and stir. Let the saltwater cool to a warm temperature before rinsing with it. Once you have finished your rinse, discard leftover solution to avoid contamination.  Nasal Congestion: Oral decongestants like sudafed but only if you do not have high Blood Pressure if you have high Blood Pressure take CORICIDIN  HBP Antihistamines Nasal Saline/Neti Pot  Cough: Expectorants (guaifenesin) help thin mucus making it easier to get up. This should be used during the day. During the day coughing helps bring mucus up out of your lungs Suppressants (dextromethorphan) just for bedtime so you can sleep   Oral rehydration is important when you have been sweating more than usual and/or having nausea and vomiting. You can use over the counter rehydration supplements like Pedialyte sport, Gatorlyte, Electrolit, Liquid IV, or you can make your own at home. Recipe:  Mix 1 liter of clean or boiled water with 6 teaspoons (2 tablespoons) of sugar and 1/2 tsp of salt. Stir until both dissolve.  You can add sugar free flavoring (i.e. Crystal light) if desired.  Drink small sips frequently rather than large amounts at once to prevent nausea.

## 2023-06-26 NOTE — ED Provider Notes (Signed)
[facility]    CSN: [id number] Arrival date & time: 06/26/23  0957      History   Chief Complaint Chief Complaint  Patient presents with   Cough   UTI Symptoms     HPI Genisis Sonnier is a 38 y.o. female.   The history is provided by the patient.  Cough Associated symptoms: fever and rhinorrhea   Associated symptoms: no diaphoresis, no headaches, no myalgias, no rash and no sore throat    Not feeling well for 8 days started by feeling tired, 4 days ago associated with 1 day of fever.  Multiple family members with URI symptoms no confirmed COVID strep or flu exposures.  Also concerned she may have a urinary tract infection states she had 1 in December but her symptoms never fully cleared.  Chart reviewed, patient had positive urine culture for Staphylococcus saprophyticus treated with Macrobid.  Her symptoms include dysuria and urgency.  Denies frequency, hematuria, cloudy or malodorous urine, lower abdominal pain, back pain nausea, vomiting, vaginal discharge or irregular vaginal pain.  LMP 05/30/2023. Allergy to Bactrim.   History reviewed. No pertinent past medical history.  Patient Active Problem List   Diagnosis Date Noted   History of reversal of tubal ligation    History of ectopic pregnancy     Past Surgical History:  Procedure Laterality Date   LAPAROSCOPIC OVARIAN CYSTECTOMY N/A 05/03/2019   Procedure: LAPAROSCOPIC SALPINGECTOMY;  Surgeon: Schermerhorn, Ihor Austin, MD;  Location: [facility];  Service: Gynecology;  Laterality: N/A;   TUBAL LIGATION     tubal ligation reversal     WISDOM TOOTH EXTRACTION     one; early 15s    OB History     Gravida  4   Para  3   Term  3   Preterm      AB  1   Living  3      SAB  0   IAB      Ectopic  1   Multiple  0   Live Births  3            Home Medications    Prior to Admission medications   Medication Sig Start Date End Date Taking? Authorizing Provider  ASHWAGANDHA PO Take 2  tablets by mouth daily at 6 (six) AM.    [provider]  Cyanocobalamin (VITAMIN B12 PO) Take 1 tablet by mouth daily at 6 (six) AM.    [provider]  Drospirenone (SLYND) 4 MG TABS Take 1 tablet (4 mg total) by mouth daily at 6 (six) AM. 12/01/22   Walker, Judye Bos, CNM  Misc Natural Products (PLACENTA PO) Take 1 tablet by mouth 2 (two) times daily.    [provider]  nitrofurantoin, macrocrystal-monohydrate, (MACROBID) 100 MG capsule Take 1 capsule (100 mg total) by mouth 2 (two) times daily. 05/24/23   Celedonio Savage, MD  Phenazopyrid-Cranbry-C-Probiot (AZO URINARY TRACT SUPPORT PO) Take 1 tablet by mouth daily at 6 (six) AM.    [provider]    Family History Family History  Problem Relation Age of Onset   Diabetes Mother        pre diabetic   Fibromyalgia Mother    Eczema Maternal Grandmother    Cancer Maternal Grandmother 36       stage 4 - unsure of what kind   Heart attack Maternal Grandmother    Cancer Maternal Grandfather 50       prostate; throat -  68   Diabetes Maternal Grandfather    Cancer Paternal Grandfather        passed away in his 50s; kind unknown by pt   Asthma Half-Sister        maternal half sister    Social History Social History   Tobacco Use   Smoking status: Former   Smokeless tobacco: Never  Advertising account planner   Vaping status: Never Used  Substance Use Topics   Alcohol use: Not Currently    Comment: occasionally   Drug use: Not Currently     Allergies   Sulfamethoxazole-trimethoprim   Review of Systems Review of Systems  Constitutional:  Positive for fatigue and fever. Negative for appetite change and diaphoresis.  HENT:  Positive for congestion and rhinorrhea. Negative for sore throat.   Respiratory:  Positive for cough.   Gastrointestinal:  Negative for abdominal pain, nausea and vomiting.  Genitourinary:  Positive for dysuria, urgency and vaginal bleeding. Negative for difficulty urinating,  frequency, hematuria and vaginal discharge.  Musculoskeletal:  Negative for myalgias.  Skin:  Negative for rash.  Neurological:  Negative for headaches.     Physical Exam Triage Vital Signs ED Triage Vitals  Encounter Vitals Group     BP 06/26/23 1002 105/65     Systolic BP Percentile --      Diastolic BP Percentile --      Pulse Rate 06/26/23 1002 65     Resp 06/26/23 1002 16     Temp 06/26/23 1002 98.5 F (36.9 C)     Temp Source 06/26/23 1002 Oral     SpO2 06/26/23 1002 96 %     Weight 06/26/23 1020 173 lb (78.5 kg)     Height 06/26/23 1020 5\' 3"  (1.6 m)     Head Circumference --      Peak Flow --      Pain Score 06/26/23 1020 0     Pain Loc --      Pain Education --      Exclude from Growth Chart --    No data found.  Updated Vital Signs BP 105/65 (BP Location: Right Arm)   Pulse 65   Temp 98.5 F (36.9 C) (Oral)   Resp 16   Ht 5\' 3"  (1.6 m)   Wt 173 lb (78.5 kg)   LMP 05/30/2023 (Exact Date)   SpO2 96%   Breastfeeding No   BMI 30.65 kg/m   Visual Acuity Right Eye Distance:   Left Eye Distance:   Bilateral Distance:    Right Eye Near:   Left Eye Near:    Bilateral Near:     Physical Exam Vitals and nursing note reviewed.  Constitutional:      Appearance: She is not ill-appearing.  HENT:     Head: Normocephalic and atraumatic.     Right Ear: Tympanic membrane and ear canal normal.     Left Ear: Tympanic membrane and ear canal normal.     Mouth/Throat:     Mouth: Mucous membranes are moist.  Eyes:     Conjunctiva/sclera: Conjunctivae normal.  Cardiovascular:     Rate and Rhythm: Normal rate.     Heart sounds: Normal heart sounds.  Pulmonary:     Effort: Pulmonary effort is normal. No respiratory distress.     Breath sounds: Normal breath sounds. No wheezing or rales.  Abdominal:     Palpations: Abdomen is soft.     Tenderness: There is no abdominal tenderness. There is no right CVA  tenderness or left CVA tenderness.  Musculoskeletal:      Cervical back: Neck supple.  Lymphadenopathy:     Cervical: No cervical adenopathy.  Skin:    General: Skin is warm and dry.  Neurological:     Mental Status: She is alert and oriented to person, place, and time.      UC Treatments / Results  Labs (all labs ordered are listed, but only abnormal results are displayed) Labs Reviewed  POC COVID19/FLU A&B COMBO  POCT URINALYSIS DIP (MANUAL ENTRY)  POCT URINE PREGNANCY    EKG   Radiology No results found.  Procedures Procedures (including critical care time)  Medications Ordered in UC Medications - No data to display  Initial Impression / Assessment and Plan / UC Course  I have reviewed the triage vital signs and the nursing notes.  Pertinent labs & imaging results that were available during my care of the patient were reviewed by me and considered in my medical decision making (see chart for details).     38 year old female with 8 days of not feeling well, had fever for 1 day, has cough, congestion, body aches fatigue.  Concerned she may have a UTI had 1 recently but symptoms never fully resolved.  She is well-appearing, nontoxic, no evidence of bacterial infection on exam.  Point-of-care urinalysis is normal, negative for RBCs nitrates and leuks.  Point-of-care pregnancy is negative.  Point-of-care COVID and flu are negative Discussed with patient likely viral syndrome, will treat for UTI based on persistent symptoms, counseled to follow-up with her PCP if urinary symptoms fail to improve. Final Clinical Impressions(s) / UC Diagnoses   Final diagnoses:  None   Discharge Instructions   None    ED Prescriptions   None    PDMP not reviewed this encounter.   Meliton Rattan, [place] 06/26/23 1054

## 2023-06-26 NOTE — ED Triage Notes (Signed)
Pt presents with complaints of productive cough, fever, and loss of voice since Tuesday 1/21. Pt is also reporting discomfort when urinating. Pt had a UTI about 3-4 weeks ago and given antibiotic although symptoms did not improve. Pt currently denies pain. Dayquil and Nyquil are the only OTC medications taken with no relief.

## 2023-06-29 ENCOUNTER — Other Ambulatory Visit: Payer: Self-pay

## 2023-06-29 DIAGNOSIS — R350 Frequency of micturition: Secondary | ICD-10-CM

## 2023-10-29 LAB — LAB REPORT - SCANNED
A1c: 5.2
Creatinine, POC: 72 mg/dL

## 2023-11-08 DIAGNOSIS — M531 Cervicobrachial syndrome: Secondary | ICD-10-CM | POA: Diagnosis not present

## 2023-11-08 DIAGNOSIS — M5451 Vertebrogenic low back pain: Secondary | ICD-10-CM | POA: Diagnosis not present

## 2023-11-08 DIAGNOSIS — M9901 Segmental and somatic dysfunction of cervical region: Secondary | ICD-10-CM | POA: Diagnosis not present

## 2023-11-08 DIAGNOSIS — M9903 Segmental and somatic dysfunction of lumbar region: Secondary | ICD-10-CM | POA: Diagnosis not present

## 2023-11-26 DIAGNOSIS — M531 Cervicobrachial syndrome: Secondary | ICD-10-CM | POA: Diagnosis not present

## 2023-11-26 DIAGNOSIS — M5451 Vertebrogenic low back pain: Secondary | ICD-10-CM | POA: Diagnosis not present

## 2023-11-26 DIAGNOSIS — M9901 Segmental and somatic dysfunction of cervical region: Secondary | ICD-10-CM | POA: Diagnosis not present

## 2023-11-26 DIAGNOSIS — M9903 Segmental and somatic dysfunction of lumbar region: Secondary | ICD-10-CM | POA: Diagnosis not present

## 2023-12-08 ENCOUNTER — Ambulatory Visit (INDEPENDENT_AMBULATORY_CARE_PROVIDER_SITE_OTHER): Admitting: Family Medicine

## 2023-12-08 ENCOUNTER — Encounter: Payer: Self-pay | Admitting: Family Medicine

## 2023-12-08 VITALS — BP 112/66 | HR 60 | Resp 16 | Ht 63.0 in | Wt 176.0 lb

## 2023-12-08 DIAGNOSIS — Z0000 Encounter for general adult medical examination without abnormal findings: Secondary | ICD-10-CM

## 2023-12-08 DIAGNOSIS — Z5181 Encounter for therapeutic drug level monitoring: Secondary | ICD-10-CM

## 2023-12-08 DIAGNOSIS — R799 Abnormal finding of blood chemistry, unspecified: Secondary | ICD-10-CM | POA: Diagnosis not present

## 2023-12-08 DIAGNOSIS — M2550 Pain in unspecified joint: Secondary | ICD-10-CM

## 2023-12-08 DIAGNOSIS — R748 Abnormal levels of other serum enzymes: Secondary | ICD-10-CM | POA: Diagnosis not present

## 2023-12-08 DIAGNOSIS — M6289 Other specified disorders of muscle: Secondary | ICD-10-CM

## 2023-12-08 DIAGNOSIS — E66811 Obesity, class 1: Secondary | ICD-10-CM

## 2023-12-08 DIAGNOSIS — Z6831 Body mass index (BMI) 31.0-31.9, adult: Secondary | ICD-10-CM

## 2023-12-08 NOTE — Progress Notes (Unsigned)
 Name: Amirrah Quigley   MRN: [id number]    DOB: 10-Jan-1986   Date:12/08/2023       Progress Note  Chief Complaint  Patient presents with   Establish Care   Consult    Recent abnormal labs she would like to discuss- liver and kidney     Subjective:   Gaylon Melchor is a 38 y.o. female, presents to clinic to est care, concerns with past labs that she wants to review today  Last PCP [facility], CPE last done 10/2022 with Lyle Setters NP More recent visits with OBGYN   Last CMP 2020 - more in care everywhere 11/17/2022 Some anemia last CBC 09/20/2022, more recent in care everywhere h/h normal 11/17/2022 Hemoglobin  Date Value Ref Range Status  09/20/2022 9.8 (L) 12.0 - 15.0 g/dL Final  95/80/7975 88.5 (L) 12.0 - 15.0 g/dL Final  98/74/7975 89.6 (L) 11.1 - 15.9 g/dL Final  90/81/7976 88.3 11.1 - 15.9 g/dL Final  87/97/7979 88.5 (L) 12.0 - 15.0 g/dL Final  88/85/7979 87.8 12.0 - 15.0 g/dL Final   Discussed the use of AI scribe software for clinical note transcription with the patient, who gave verbal consent to proceed.  History of Present Illness Sheanna Dail is a 38 year old female who presents to establish care and review abnormal lab results from a life insurance application.  Abnormal liver enzymes - Mildly elevated Gamma-Glutamyl Transferase (GGT) identified on laboratory testing for life insurance application - No history of liver disease - No alcohol use  Pelvic floor dysfunction and urinary incontinence - Pelvic floor dysfunction following last pregnancy - Bladder issues with occasional urinary leakage, especially when sneezing or jumping - Previously underwent pelvic floor therapy but discontinued due to work commitments - Currently performs Kegel exercises with reported improvement in symptoms  Gastrointestinal symptoms and bowel habits - Chronic digestive issues managed with a diet high in fruits and vegetables - Occasional use of Miralax when traveling to  prevent constipation - Previous use of Senna and Urelle for symptom management  Weight management - Actively attempting weight loss with a low-carbohydrate diet and regular exercise since seven weeks postpartum - Persistent concern regarding weight not returning to desired level 14 months after childbirth  Obstetric and surgical history - History of tubal ligation and reversal, resulting in right-sided ectopic pregnancy - Successful pregnancy and live birth three years after reversal - Husband has undergone vasectomy       Current Outpatient Medications:    ASHWAGANDHA PO, Take 2 tablets by mouth daily at 6 (six) AM., Disp: , Rfl:    Glucosamine HCl (GLUCOSAMINE PO), Take 1 capsule by mouth daily., Disp: , Rfl:    Cyanocobalamin (VITAMIN B12 PO), Take 1 tablet by mouth daily at 6 (six) AM. (Patient not taking: Reported on 12/08/2023), Disp: , Rfl:    Drospirenone  (SLYND ) 4 MG TABS, Take 1 tablet (4 mg total) by mouth daily at 6 (six) AM. (Patient not taking: Reported on 12/08/2023), Disp: 28 tablet, Rfl: 11   Misc Natural Products (PLACENTA PO), Take 1 tablet by mouth 2 (two) times daily. (Patient not taking: Reported on 12/08/2023), Disp: , Rfl:   Patient Active Problem List   Diagnosis Date Noted   History of reversal of tubal ligation    History of ectopic pregnancy     Past Surgical History:  Procedure Laterality Date   LAPAROSCOPIC OVARIAN CYSTECTOMY N/A 05/03/2019   Procedure: LAPAROSCOPIC SALPINGECTOMY;  Surgeon: Schermerhorn, Debby PARAS, MD;  Location: [facility] ORS;  Service: Gynecology;  Laterality: N/A;   TUBAL LIGATION     tubal ligation reversal     WISDOM TOOTH EXTRACTION     one; early 68s    Family History  Problem Relation Age of Onset   Diabetes Mother        pre diabetic   Fibromyalgia Mother    Eczema Maternal Grandmother    Cancer Maternal Grandmother 75       stage 4 - unsure of what kind   Heart attack Maternal Grandmother    Cancer Maternal Grandfather 50        prostate; throat - 68   Diabetes Maternal Grandfather    Cancer Paternal Grandfather        passed away in his 9s; kind unknown by pt   Asthma Half-Sister        maternal half sister    Social History   Tobacco Use   Smoking status: Former    Types: Cigarettes   Smokeless tobacco: Never  Vaping Use   Vaping status: Never Used  Substance Use Topics   Alcohol use: Not Currently    Comment: occasionally   Drug use: Never     Allergies  Allergen Reactions   Sulfamethoxazole-Trimethoprim Itching and Rash    Health Maintenance  Topic Date Due   COVID-19 Vaccine (4 - 2024-25 season) 12/23/2023 (Originally 01/31/2023)   Hepatitis B Vaccines (1 of 3 - 19+ 3-dose series) 12/06/2024 (Originally 07/24/2002)   HPV VACCINES (1 - 3-dose SCDM series) 12/06/2024 (Originally 07/24/2010)   INFLUENZA VACCINE  12/31/2023   Cervical Cancer Screening (HPV/Pap Cotest)  02/26/2027   DTaP/Tdap/Td (3 - Td or Tdap) 06/15/2032   Hepatitis C Screening  Completed   HIV Screening  Completed   Meningococcal B Vaccine  Aged Out    Chart Review Today: I personally reviewed active problem list, medication list, allergies, family history, social history, health maintenance, notes from last encounter, lab results, imaging with the patient/caregiver today.   Review of Systems  Constitutional: Negative.   HENT: Negative.    Eyes: Negative.   Respiratory: Negative.    Cardiovascular: Negative.   Gastrointestinal: Negative.   Endocrine: Negative.   Genitourinary: Negative.   Musculoskeletal: Negative.   Skin: Negative.   Allergic/Immunologic: Negative.   Neurological: Negative.   Hematological: Negative.   Psychiatric/Behavioral: Negative.    All other systems reviewed and are negative.    Objective:   Vitals:   12/08/23 1519  BP: 112/66  Pulse: 60  Resp: 16  Weight: 176 lb (79.8 kg)  Height: 5' 3 (1.6 m)    Body mass index is 31.18 kg/m.  Physical Exam Vitals and nursing note  reviewed.  Constitutional:      General: She is not in acute distress.    Appearance: Normal appearance. She is well-developed. She is not ill-appearing, toxic-appearing or diaphoretic.  HENT:     Head: Normocephalic and atraumatic.     Right Ear: External ear normal.     Left Ear: External ear normal.     Nose: Nose normal.  Eyes:     General: No scleral icterus.       Right eye: No discharge.        Left eye: No discharge.     Conjunctiva/sclera: Conjunctivae normal.  Neck:     Trachea: No tracheal deviation.  Cardiovascular:     Rate and Rhythm: Normal rate.  Pulmonary:     Effort: Pulmonary effort is normal. No respiratory distress.  Breath sounds: No stridor.  Skin:    General: Skin is warm and dry.     Findings: No rash.  Neurological:     Mental Status: She is alert.     Motor: No abnormal muscle tone.     Coordination: Coordination normal.     Gait: Gait normal.  Psychiatric:        Mood and Affect: Mood normal.        Behavior: Behavior normal.      Functional Status Survey: Is the patient deaf or have difficulty hearing?: No Does the patient have difficulty seeing, even when wearing glasses/contacts?: No Does the patient have difficulty concentrating, remembering, or making decisions?: No Does the patient have difficulty walking or climbing stairs?: No Does the patient have difficulty dressing or bathing?: No Does the patient have difficulty doing errands alone such as visiting a doctor's office or shopping?: No Results for orders placed or performed during the hospital encounter of 06/26/23  POCT urinalysis dipstick   Collection Time: 06/26/23 10:38 AM  Result Value Ref Range   Color, UA yellow yellow   Clarity, UA clear clear   Glucose, UA negative negative mg/dL   Bilirubin, UA negative negative   Ketones, POC UA negative negative mg/dL   Spec Grav, UA 8.984 8.989 - 1.025   Blood, UA negative negative   pH, UA 7.5 5.0 - 8.0   Protein Ur, POC  negative negative mg/dL   Urobilinogen, UA 0.2 0.2 or 1.0 E.U./dL   Nitrite, UA Negative Negative   Leukocytes, UA Negative Negative  POCT urine pregnancy   Collection Time: 06/26/23 10:38 AM  Result Value Ref Range   Preg Test, Ur Negative Negative  POC Covid19/Flu A&B Antigen   Collection Time: 06/26/23 10:51 AM  Result Value Ref Range   Influenza A Antigen, POC Negative Negative   Influenza B Antigen, POC Negative Negative   Covid Antigen, POC Negative Negative      Assessment & Plan:   Encounter for medical examination to establish care -     CBC with Differential/Platelet -     Comprehensive metabolic panel with GFR -     Lipid panel -     Gamma GT  Pelvic floor dysfunction  Elevated serum GGT level -     Comprehensive metabolic panel with GFR -     Gamma GT  Elevated BUN -     Comprehensive metabolic panel with GFR -     Gamma GT  Encounter for medication monitoring -     CBC with Differential/Platelet -     Comprehensive metabolic panel with GFR -     Lipid panel -     Gamma GT  Arthralgia, unspecified joint  Class 1 obesity with body mass index (BMI) of 31.0 to 31.9 in adult, unspecified obesity type, unspecified whether serious comorbidity present -     CBC with Differential/Platelet -     Comprehensive metabolic panel with GFR -     Lipid panel  Adult general medical exam -     CBC with Differential/Platelet -     Comprehensive metabolic panel with GFR -     Lipid panel   Assessment and Plan Assessment & Plan Pelvic Floor Dysfunction Pelvic floor dysfunction with postpartum bladder issues. Symptoms have improved with pelvic floor exercises and lifestyle modifications, though occasional stress incontinence persists. Previous pelvic floor therapy was beneficial but challenging to maintain due to work and family commitments. - Continue pelvic floor exercises  and lifestyle modifications. - Consider referral to pelvic floor therapy if symptoms worsen  or become more bothersome.  Elevated Gamma-Glutamyl Transferase (GGT) Mildly elevated GGT in recent labs without other liver enzyme abnormalities. The significance of isolated elevated GGT is unclear. Other liver enzymes are normal. - Research the significance of isolated elevated GGT. - Recheck liver enzymes and GGT as part of a comprehensive metabolic panel. Pt does not drink ETOH, no suspicious supplements, obesity mild with healthy diet, no past imaging and no RUQ abd sx  Slightly Elevated Blood Urea Nitrogen (BUN) BUN slightly above normal range, consistent with previous labs. Other kidney function tests are normal, indicating no significant kidney dysfunction. The elevation may represent normal variation for her. - Recheck BUN as part of a comprehensive metabolic panel.  General Health Maintenance Discussed regular physical exams, mammograms starting at age 59, and cervical cancer screening. Encouraged healthy lifestyle and weight management through diet and exercise. - Schedule a complete physical exam. - Order mammogram as part of routine screening for women aged 62 and above. - Discuss weight management strategies during the physical exam.  Recording duration: 38 minutes    Pt decided to do labs when she returns for CPE  Return for CPE in next few months per pt preference.   Michelene Cower, PA-C 12/08/23 3:46 PM

## 2023-12-09 ENCOUNTER — Encounter: Payer: Self-pay | Admitting: Family Medicine

## 2023-12-11 ENCOUNTER — Encounter: Payer: Self-pay | Admitting: Family Medicine

## 2024-01-25 ENCOUNTER — Encounter: Admitting: Family Medicine

## 2024-02-08 DIAGNOSIS — L918 Other hypertrophic disorders of the skin: Secondary | ICD-10-CM | POA: Diagnosis not present

## 2024-04-10 DIAGNOSIS — R92323 Mammographic fibroglandular density, bilateral breasts: Secondary | ICD-10-CM | POA: Diagnosis not present

## 2024-04-10 DIAGNOSIS — Z1231 Encounter for screening mammogram for malignant neoplasm of breast: Secondary | ICD-10-CM | POA: Diagnosis not present

## 2024-04-12 ENCOUNTER — Ambulatory Visit
Admission: EM | Admit: 2024-04-12 | Discharge: 2024-04-12 | Disposition: A | Discharge status: Home / Self Care | Attending: Physician Assistant | Admitting: Physician Assistant

## 2024-04-12 DIAGNOSIS — R110 Nausea: Secondary | ICD-10-CM

## 2024-04-12 DIAGNOSIS — J069 Acute upper respiratory infection, unspecified: Secondary | ICD-10-CM

## 2024-04-12 DIAGNOSIS — J029 Acute pharyngitis, unspecified: Secondary | ICD-10-CM

## 2024-04-12 DIAGNOSIS — R519 Headache, unspecified: Secondary | ICD-10-CM

## 2024-04-12 LAB — POCT RAPID STREP A (OFFICE): Rapid Strep A Screen: NEGATIVE

## 2024-04-12 MED ORDER — IPRATROPIUM BROMIDE 0.06 % NA SOLN: 2.0000 | Freq: Four times a day (QID) | NASAL | 0 refills | Status: AC

## 2024-04-12 MED ORDER — PSEUDOEPHEDRINE-GUAIFENESIN ER 60-600 MG PO TB12: 1.0000 | ORAL_TABLET | Freq: Two times a day (BID) | ORAL | 0 refills | Status: AC

## 2024-04-12 MED ORDER — ONDANSETRON 4 MG PO TBDP: 4.0000 mg | ORAL_TABLET | Freq: Three times a day (TID) | ORAL | 0 refills | Status: AC | PRN

## 2024-04-12 NOTE — Discharge Instructions (Addendum)
 -  Negative strep.  URI/COLD SYMPTOMS: Your exam today is consistent with a viral illness. Antibiotics are not indicated at this time. Use medications as directed, including cough syrup, nasal saline, and decongestants. Your symptoms should improve over the next few days and resolve within 7-10 days. Increase rest and fluids. F/u if symptoms worsen or predominate such as sore throat, ear pain, productive cough, shortness of breath, or if you develop high fevers or worsening fatigue over the next several days.

## 2024-04-12 NOTE — ED Triage Notes (Signed)
 Pt c/o sore throat,HA,congestion & nausea x6 days. Has tried OTC meds w/o relief.

## 2024-04-12 NOTE — ED Provider Notes (Signed)
 [facility]    CSN: [id number] Arrival date & time: 04/12/24  1015      History   Chief Complaint Chief Complaint  Patient presents with   Headache   Cough   Nasal Congestion    HPI Kelsey Campbell is a 38 y.o. female presenting for 5 to 6-day history of sore throat, postnasal drainage, fatigue, nausea, headaches and sinus pressure.  States today she had worsening headache and increased pressure in her face.  Denies fever, cough, chest pain, shortness of breath, vomiting.  States she has been using a over-the-counter nasal spray as well as cold and flu medication without relief.  HPI  History reviewed. No pertinent past medical history.  Patient Active Problem List   Diagnosis Date Noted   History of reversal of tubal ligation    History of ectopic pregnancy     Past Surgical History:  Procedure Laterality Date   LAPAROSCOPIC OVARIAN CYSTECTOMY N/A 05/03/2019   Procedure: LAPAROSCOPIC SALPINGECTOMY;  Surgeon: Schermerhorn, Debby PARAS, MD;  Location: [facility] ORS;  Service: Gynecology;  Laterality: N/A;   TUBAL LIGATION     tubal ligation reversal     WISDOM TOOTH EXTRACTION     one; early 48s    OB History     Gravida  4   Para  3   Term  3   Preterm      AB  1   Living  3      SAB  0   IAB      Ectopic  1   Multiple  0   Live Births  3            Home Medications    Prior to Admission medications   Medication Sig Start Date End Date Taking? Authorizing Provider  ipratropium (ATROVENT) 0.06 % nasal spray Place 2 sprays into both nostrils 4 (four) times daily. 04/12/24  Yes Arvis Huxley B, PA-C  ondansetron  (ZOFRAN -ODT) 4 MG disintegrating tablet Take 1 tablet (4 mg total) by mouth every 8 (eight) hours as needed. 04/12/24  Yes Arvis Huxley NOVAK, PA-C  pseudoephedrine-guaifenesin (MUCINEX D) 60-600 MG 12 hr tablet Take 1 tablet by mouth every 12 (twelve) hours. 04/12/24  Yes Arvis Huxley B, PA-C  ASHWAGANDHA PO Take 2 tablets by  mouth daily at 6 (six) AM.    [provider]  Glucosamine HCl (GLUCOSAMINE PO) Take 1 capsule by mouth daily.    [provider]    Family History Family History  Problem Relation Age of Onset   Kidney disease Mother    Diabetes Mother        pre diabetic   Fibromyalgia Mother    Eczema Maternal Grandmother    Cancer Maternal Grandmother 79       stage 4 - unsure of what kind   Heart attack Maternal Grandmother    Cancer Maternal Grandfather 38       prostate; throat - 68   Diabetes Maternal Grandfather    Cancer Paternal Grandfather        passed away in his 62s; kind unknown by pt   Asthma Half-Sister        maternal half sister    Social History Social History   Tobacco Use   Smoking status: Former    Types: Cigarettes   Smokeless tobacco: Never  Vaping Use   Vaping status: Never Used  Substance Use Topics   Alcohol use: Not Currently    Comment: occasionally  Drug use: Never     Allergies   Sulfamethoxazole-trimethoprim   Review of Systems Review of Systems  Constitutional:  Positive for chills and fatigue. Negative for fever.  HENT:  Positive for congestion, rhinorrhea, sinus pressure and sore throat. Negative for ear pain.   Respiratory:  Negative for cough and shortness of breath.   Cardiovascular:  Negative for chest pain.  Gastrointestinal:  Positive for nausea. Negative for abdominal pain, diarrhea and vomiting.  Musculoskeletal:  Negative for arthralgias and myalgias.  Skin:  Negative for rash.  Neurological:  Positive for headaches. Negative for weakness.  Hematological:  Negative for adenopathy.     Physical Exam Triage Vital Signs ED Triage Vitals  Encounter Vitals Group     BP 04/12/24 1043 105/74     Girls Systolic BP Percentile --      Girls Diastolic BP Percentile --      Boys Systolic BP Percentile --      Boys Diastolic BP Percentile --      Pulse Rate 04/12/24 1043 73     Resp 04/12/24 1043 18     Temp  04/12/24 1043 98.8 F (37.1 C)     Temp Source 04/12/24 1043 Oral     SpO2 04/12/24 1043 98 %     Weight 04/12/24 1042 168 lb 8 oz (76.4 kg)     Height --      Head Circumference --      Peak Flow --      Pain Score 04/12/24 1042 10     Pain Loc --      Pain Education --      Exclude from Growth Chart --    No data found.  Updated Vital Signs BP 105/74 (BP Location: Right Arm)   Pulse 73   Temp 98.8 F (37.1 C) (Oral)   Resp 18   Wt 168 lb 8 oz (76.4 kg)   LMP 03/10/2024 (Approximate)   SpO2 98%   BMI 29.85 kg/m      Physical Exam Vitals and nursing note reviewed.  Constitutional:      General: She is not in acute distress.    Appearance: Normal appearance. She is ill-appearing. She is not toxic-appearing.  HENT:     Head: Normocephalic and atraumatic.     Right Ear: Tympanic membrane, ear canal and external ear normal.     Left Ear: Tympanic membrane, ear canal and external ear normal.     Nose: Congestion present.     Mouth/Throat:     Mouth: Mucous membranes are moist.     Pharynx: Oropharynx is clear. Posterior oropharyngeal erythema present.     Comments: Mild postnasal drainage. Eyes:     General: No scleral icterus.       Right eye: No discharge.        Left eye: No discharge.     Conjunctiva/sclera: Conjunctivae normal.  Cardiovascular:     Rate and Rhythm: Normal rate and regular rhythm.     Heart sounds: Normal heart sounds.  Pulmonary:     Effort: Pulmonary effort is normal. No respiratory distress.     Breath sounds: Normal breath sounds.  Musculoskeletal:     Cervical back: Neck supple.  Skin:    General: Skin is dry.  Neurological:     General: No focal deficit present.     Mental Status: She is alert. Mental status is at baseline.     Motor: No weakness.     Gait:  Gait normal.  Psychiatric:        Mood and Affect: Mood normal.        Behavior: Behavior normal.      UC Treatments / Results  Labs (all labs ordered are listed, but  only abnormal results are displayed) Labs Reviewed  POCT RAPID STREP A (OFFICE) - Normal    EKG   Radiology No results found.  Procedures Procedures (including critical care time)  Medications Ordered in UC Medications - No data to display  Initial Impression / Assessment and Plan / UC Course  I have reviewed the triage vital signs and the nursing notes.  Pertinent labs & imaging results that were available during my care of the patient were reviewed by me and considered in my medical decision making (see chart for details).   38 year old female presents for 5 to 6-day history of congestion, headaches, sinus pressure, nausea and fatigue.  No reported fever, cough, chest pain, shortness of breath.  Mild sore throat but says it is the least of her complaints.  Taking OTC meds without relief.  Vitals are stable and normal.  On exam no evidence of ear infection.  Has mild nasal congestion and erythema posterior pharynx with clear postnasal drainage.  Chest is clear.  Heart regular rate and rhythm.  Rapid strep is negative.  Acute URI.  Supportive care encouraged increased rest and fluids.  Advised Tylenol  Motrin  for headache.  Sent Mucinex D, Zofran  and Atrovent nasal spray to pharmacy.  Advised to return for acute worsening of symptoms or no improvement in the next 4 to 5 days.  Work note given.   Final Clinical Impressions(s) / UC Diagnoses   Final diagnoses:  Sore throat  Viral upper respiratory tract infection  Acute nonintractable headache, unspecified headache type  Nausea without vomiting     Discharge Instructions      -Negative strep  URI/COLD SYMPTOMS: Your exam today is consistent with a viral illness. Antibiotics are not indicated at this time. Use medications as directed, including cough syrup, nasal saline, and decongestants. Your symptoms should improve over the next few days and resolve within 7-10 days. Increase rest and fluids. F/u if symptoms worsen or  predominate such as sore throat, ear pain, productive cough, shortness of breath, or if you develop high fevers or worsening fatigue over the next several days.       ED Prescriptions     Medication Sig Dispense Auth. Provider   pseudoephedrine-guaifenesin (MUCINEX D) 60-600 MG 12 hr tablet Take 1 tablet by mouth every 12 (twelve) hours. 14 tablet Arvis Huxley B, PA-C   ondansetron  (ZOFRAN -ODT) 4 MG disintegrating tablet Take 1 tablet (4 mg total) by mouth every 8 (eight) hours as needed. 15 tablet Arvis Huxley B, PA-C   ipratropium (ATROVENT) 0.06 % nasal spray Place 2 sprays into both nostrils 4 (four) times daily. 15 mL Arvis Huxley NOVAK, PA-C      PDMP not reviewed this encounter.   Arvis Huxley NOVAK, PA-C 04/12/24 1108

## 2024-04-13 ENCOUNTER — Ambulatory Visit: Payer: Self-pay
